# Patient Record
Sex: Male | Born: 1950 | Race: White | Hispanic: No | Marital: Married | Smoking: Never smoker
Health system: Southern US, Community
[De-identification: ages and names within clinical notes are randomized; demographics above are authoritative.]

## PROBLEM LIST (undated history)

## (undated) DIAGNOSIS — K5792 Diverticulitis of intestine, part unspecified, without perforation or abscess without bleeding: Secondary | ICD-10-CM

## (undated) DIAGNOSIS — K219 Gastro-esophageal reflux disease without esophagitis: Secondary | ICD-10-CM

## (undated) DIAGNOSIS — G4730 Sleep apnea, unspecified: Secondary | ICD-10-CM

## (undated) DIAGNOSIS — I10 Essential (primary) hypertension: Secondary | ICD-10-CM

## (undated) DIAGNOSIS — M1990 Unspecified osteoarthritis, unspecified site: Secondary | ICD-10-CM

## (undated) DIAGNOSIS — D34 Benign neoplasm of thyroid gland: Secondary | ICD-10-CM

## (undated) DIAGNOSIS — I82409 Acute embolism and thrombosis of unspecified deep veins of unspecified lower extremity: Secondary | ICD-10-CM

## (undated) HISTORY — PX: CATARACT EXTRACTION: SUR2

## (undated) HISTORY — PX: APPENDECTOMY: SHX54

## (undated) HISTORY — PX: JOINT REPLACEMENT: SHX530

## (undated) HISTORY — PX: THYROID LOBECTOMY: SHX420

## (undated) HISTORY — PX: STAPEDES SURGERY: SHX789

## (undated) HISTORY — PX: COLON RESECTION: SHX5231

## (undated) HISTORY — PX: ROTATOR CUFF REPAIR: SHX139

---

## 2011-06-15 ENCOUNTER — Encounter: Payer: Self-pay | Admitting: *Deleted

## 2011-06-15 ENCOUNTER — Emergency Department (HOSPITAL_BASED_OUTPATIENT_CLINIC_OR_DEPARTMENT_OTHER)
Admission: EM | Admit: 2011-06-15 | Discharge: 2011-06-15 | Disposition: A | Discharge status: Home / Self Care | Payer: BC Managed Care – PPO | Attending: Emergency Medicine | Admitting: Emergency Medicine

## 2011-06-15 ENCOUNTER — Emergency Department (HOSPITAL_BASED_OUTPATIENT_CLINIC_OR_DEPARTMENT_OTHER): Payer: BC Managed Care – PPO

## 2011-06-15 ENCOUNTER — Emergency Department (INDEPENDENT_AMBULATORY_CARE_PROVIDER_SITE_OTHER): Payer: BC Managed Care – PPO

## 2011-06-15 DIAGNOSIS — M79609 Pain in unspecified limb: Secondary | ICD-10-CM | POA: Insufficient documentation

## 2011-06-15 DIAGNOSIS — G4730 Sleep apnea, unspecified: Secondary | ICD-10-CM | POA: Insufficient documentation

## 2011-06-15 DIAGNOSIS — Z79899 Other long term (current) drug therapy: Secondary | ICD-10-CM | POA: Insufficient documentation

## 2011-06-15 DIAGNOSIS — M79606 Pain in leg, unspecified: Secondary | ICD-10-CM

## 2011-06-15 DIAGNOSIS — X58XXXA Exposure to other specified factors, initial encounter: Secondary | ICD-10-CM

## 2011-06-15 DIAGNOSIS — M25569 Pain in unspecified knee: Secondary | ICD-10-CM

## 2011-06-15 DIAGNOSIS — K219 Gastro-esophageal reflux disease without esophagitis: Secondary | ICD-10-CM | POA: Insufficient documentation

## 2011-06-15 DIAGNOSIS — I10 Essential (primary) hypertension: Secondary | ICD-10-CM | POA: Insufficient documentation

## 2011-06-15 HISTORY — DX: Essential (primary) hypertension: I10

## 2011-06-15 HISTORY — DX: Benign neoplasm of thyroid gland: D34

## 2011-06-15 HISTORY — DX: Diverticulitis of intestine, part unspecified, without perforation or abscess without bleeding: K57.92

## 2011-06-15 HISTORY — DX: Sleep apnea, unspecified: G47.30

## 2011-06-15 HISTORY — DX: Gastro-esophageal reflux disease without esophagitis: K21.9

## 2011-06-15 LAB — BASIC METABOLIC PANEL
BUN: 19 mg/dL (ref 6–23)
Chloride: 105 mEq/L (ref 96–112)
Creatinine, Ser: 0.8 mg/dL (ref 0.50–1.35)
GFR calc Af Amer: >90 mL/min (ref >90)
GFR calc non Af Amer: >90 mL/min (ref >90)

## 2011-06-15 MED ORDER — OXYCODONE-ACETAMINOPHEN 5-325 MG PO TABS: 1.0000 | ORAL_TABLET | Freq: Once | ORAL | Status: DC

## 2011-06-15 NOTE — ED Notes (Signed)
Pt reports posterior left leg pain behind knee which started 1 week ago. Pain resolved but returned today- pt also c/o feeling sweaty earlier

## 2011-06-15 NOTE — ED Provider Notes (Signed)
History  This chart was scribed for Javier Cellar, MD by Bennett Scrape. This patient was seen in room MH10/MH10 and the patient's care was started at 5:22PM.  CSN: [id number] Arrival date & time: 06/15/2011  4:06 PM   First MD Initiated Contact with Patient 06/15/11 1708      Chief Complaint  Patient presents with  . Leg Pain    The history is provided by the patient. No language interpreter was used.   Javier Arroyo is a 60 y.o. male who presents to the Emergency Department complaining of one week of gradual onset, non-changing, intermittent, non-radiating posterior right leg pain/popliteal. Pt reports that the pain brings one episodes of nausea and diaphoresis at maximum. Pt reports that his pain is a 6 out of 10 at the worst and a 2 out of 10 currently. Pt reports that he took ASA and applied unknown gel to the area without improvement in his symptoms. Pt states that bending and pressure ease the pain. Pt denies swelling as an associated symptoms. Pt denies a h/o blood clots, any recent long travels or recent hospitalizations. No h/o VTE in self or family. He states that he does sit quite a bit at work.  Past Medical History  Diagnosis Date  . Hypertension   . Sleep apnea   . Thyroid tumor, benign   . GERD (gastroesophageal reflux disease)   . Diverticulitis     Past Surgical History  Procedure Date  . Rotator cuff repair   . Colon resection   . Appendectomy   . Stapedes surgery     No family history on file.  History  Substance Use Topics  . Smoking status: Never Smoker   . Smokeless tobacco: Never Used  . Alcohol Use: Not on file    Review of Systems A complete 10 system review of systems was obtained and is otherwise negative except as noted in the HPI.   Allergies  Statins and Penicillins  Home Medications   Current Outpatient Rx  Name Route Sig Dispense Refill  . ASPIRIN 325 MG PO TBEC Oral Take 650 mg by mouth once.      . CO Q 10 100 MG PO CAPS  Oral Take 1 tablet by mouth daily.      . OMEGA-3 FATTY ACIDS 1000 MG PO CAPS Oral Take 1 g by mouth daily.      Marland Kitchen MAGNESIUM OXIDE 400 MG PO TABS Oral Take 400 mg by mouth 2 (two) times daily.      Marland Kitchen ONE-DAILY MULTI VITAMINS PO TABS Oral Take 1 tablet by mouth daily.      . NEBIVOLOL HCL 10 MG PO TABS Oral Take 10 mg by mouth daily.      Marland Kitchen TAMSULOSIN HCL 0.4 MG PO CAPS Oral Take 0.4 mg by mouth.      . TESTOSTERONE 20.25 MG/ACT (1.62%) TD GEL Transdermal Place 40.5 mg onto the skin daily.      Marland Kitchen VALSARTAN-HYDROCHLOROTHIAZIDE 320-12.5 MG PO TABS Oral Take 1 tablet by mouth daily.        Triage Vitals: BP 136/86  Pulse 70  Temp(Src) 98.5 F (36.9 C) (Oral)  Resp 24  Ht 5\' 8"  (1.727 m)  Wt 260 lb (117.935 kg)  BMI 39.53 kg/m2  SpO2 98%  Physical Exam  Nursing note and vitals reviewed. Constitutional: He is oriented to person, place, and time. He appears well-developed and well-nourished.  HENT:  Head: Normocephalic and atraumatic.  Eyes: EOM are normal. Pupils  are equal, round, and reactive to light.  Neck: Neck supple. No tracheal deviation present.  Cardiovascular: Normal rate, regular rhythm and normal heart sounds.  Exam reveals no gallop and no friction rub.   No murmur heard. Pulmonary/Chest: Effort normal and breath sounds normal.       Lungs are clear to ausculation bilaterally.  Abdominal: Soft. There is no tenderness. There is no rebound and no guarding.  Musculoskeletal: Normal range of motion. He exhibits no edema and no tenderness (No tenderness to palpation to right calf or right knee, full ROM with no tenderness, calves are equal in length and size).       No reproducible popliteal fossa ttp No popliteal fullness Distal pulses intact Cap refill < 2sec  Strength rle 5/5  Neurological: He is alert and oriented to person, place, and time.  Skin: Skin is warm and dry.    ED Course  Procedures (including critical care time)  DIAGNOSTIC STUDIES: Oxygen Saturation  is 98% on room air, normal by my interpretation.    COORDINATION OF CARE: 5:30Pm-Discussed treatment plan with pt at bedside and pt agreed with plan.    Labs Reviewed  BASIC METABOLIC PANEL  D-DIMER, QUANTITATIVE   Dg Knee Complete 4 Views Right  06/15/2011  *RADIOLOGY REPORT*  Clinical Data: Right knee pain/injury  RIGHT KNEE - COMPLETE 4+ VIEW  Comparison: None.  Findings: No fracture or dislocation is seen.  The joint spaces are preserved.  The visualized soft tissues are unremarkable.  No suprapatellar knee joint effusion.  Infra and suprapatellar enthesopathy.  IMPRESSION: No acute osseous abnormality is seen.  Original Report Authenticated By: Charline Bills, M.D.     1. Leg pain     MDM   Popliteal pain. Low risk DVT and negative dimer. Full ROM, neurovascularly intact. Will refer for outpatient U/S tomorrow here at [facility]. Tylenol prn pain. F/U with PMD  I personally performed the services described in this documentation, which was scribed in my presence. The recorded information has been reviewed and considered.     Javier Cellar, MD 06/15/11 [phone]

## 2011-06-16 ENCOUNTER — Ambulatory Visit (HOSPITAL_BASED_OUTPATIENT_CLINIC_OR_DEPARTMENT_OTHER)
Admission: RE | Admit: 2011-06-16 | Discharge: 2011-06-16 | Disposition: A | Discharge status: Home / Self Care | Payer: BC Managed Care – PPO | Source: Ambulatory Visit | Attending: Emergency Medicine | Admitting: Emergency Medicine

## 2011-06-16 ENCOUNTER — Other Ambulatory Visit (HOSPITAL_BASED_OUTPATIENT_CLINIC_OR_DEPARTMENT_OTHER): Payer: Self-pay | Admitting: Emergency Medicine

## 2011-06-16 DIAGNOSIS — M25569 Pain in unspecified knee: Secondary | ICD-10-CM

## 2011-06-16 DIAGNOSIS — R52 Pain, unspecified: Secondary | ICD-10-CM

## 2011-06-16 DIAGNOSIS — X58XXXA Exposure to other specified factors, initial encounter: Secondary | ICD-10-CM

## 2011-06-16 DIAGNOSIS — S99919A Unspecified injury of unspecified ankle, initial encounter: Secondary | ICD-10-CM

## 2011-06-16 DIAGNOSIS — M79609 Pain in unspecified limb: Secondary | ICD-10-CM | POA: Insufficient documentation

## 2018-01-18 ENCOUNTER — Emergency Department (HOSPITAL_BASED_OUTPATIENT_CLINIC_OR_DEPARTMENT_OTHER)
Admission: EM | Admit: 2018-01-18 | Discharge: 2018-01-18 | Disposition: A | Discharge status: Home / Self Care | Payer: 59 | Attending: Emergency Medicine | Admitting: Emergency Medicine

## 2018-01-18 ENCOUNTER — Encounter (HOSPITAL_BASED_OUTPATIENT_CLINIC_OR_DEPARTMENT_OTHER): Payer: Self-pay | Admitting: *Deleted

## 2018-01-18 ENCOUNTER — Other Ambulatory Visit: Payer: Self-pay

## 2018-01-18 DIAGNOSIS — Z7982 Long term (current) use of aspirin: Secondary | ICD-10-CM | POA: Insufficient documentation

## 2018-01-18 DIAGNOSIS — H5712 Ocular pain, left eye: Secondary | ICD-10-CM | POA: Diagnosis present

## 2018-01-18 DIAGNOSIS — Z79899 Other long term (current) drug therapy: Secondary | ICD-10-CM | POA: Insufficient documentation

## 2018-01-18 DIAGNOSIS — I10 Essential (primary) hypertension: Secondary | ICD-10-CM | POA: Diagnosis not present

## 2018-01-18 DIAGNOSIS — H1132 Conjunctival hemorrhage, left eye: Secondary | ICD-10-CM | POA: Diagnosis not present

## 2018-01-18 HISTORY — DX: Unspecified osteoarthritis, unspecified site: M19.90

## 2018-01-18 MED ORDER — TETRACAINE HCL 0.5 % OP SOLN
1.0000 [drp] | Freq: Once | OPHTHALMIC | Status: AC
  Administered 2018-01-18: 1 [drp] via OPHTHALMIC
  Filled 2018-01-18: qty 4

## 2018-01-18 MED ORDER — FLUORESCEIN SODIUM 1 MG OP STRP
1.0000 | ORAL_STRIP | Freq: Once | OPHTHALMIC | Status: AC
  Administered 2018-01-18: 1 via OPHTHALMIC
  Filled 2018-01-18: qty 1

## 2018-01-18 NOTE — ED Notes (Signed)
ED Provider at bedside. 

## 2018-01-18 NOTE — Discharge Instructions (Signed)
As we discussed, please follow-up with your eye doctor.  Call his office tomorrow and arrange for an appointment.  Return the emergency department for any vision changes, worsening pain, fever, redness or swelling of the face or any other worsening or concerning symptoms.

## 2018-01-18 NOTE — ED Provider Notes (Addendum)
Medical screening examination/treatment/procedure(s) were conducted as a shared visit with non-physician practitioner(s) and myself.  I personally evaluated the patient during the encounter.  None   Patient seen by me along with physician assistant.  Patient this evening around 5 PM did have a couple hard sneezes and also was rubbing his left eye and he felt as if may be something popped.  And then he had all this blood in the white part of his eye.  Contain not bleeding out of the eye.  Patient about 2 months ago had cataract surgery so he has an ophthalmologist to follow-up with.  He had some leftover moxifloxacin eyedrops which he put in.  Patient without any eye pain.  No evidence of hyphema.  Corneal abrasion ruled out by physician assistant.  This looks like a scleral hemorrhage.  Non-complicated.  Patient can follow-up with ophthalmologist just out of concern from the cataract surgery.  No visual changes at all.  No photophobia.   Fredia Sorrow, MD 01/18/18 2919    Fredia Sorrow, MD 01/19/18 [phone]

## 2018-01-18 NOTE — ED Notes (Signed)
Wore glasses during visual acuity

## 2018-01-18 NOTE — ED Provider Notes (Signed)
[facility] EMERGENCY DEPARTMENT Provider Note   CSN: [id number] Arrival date & time: 01/18/18  2121     History   Chief Complaint Chief Complaint  Patient presents with  . Eye Injury    HPI Javier Arroyo is a 67 y.o. male who presents for evaluation of left eye pain, redness that began this evening.  Patient reports that he was watching TV when he felt like he had something in his eye.  Patient states he started rubbing it and trying to press it to get the pain to be better.  He states he went in the bathroom and noticed that his eye was complete really red.  He does state that he was working on the yard earlier this evening but but does not recall any specific trauma, injury to the eye.  Patient reports that he was wearing glasses at the time.  Does not have a history of cataract surgery and states that he had some leftover moxifloxacin eyedrops which he states he used.  He reports that he is not having any blurry vision.  She denies any fevers.  The history is provided by the patient.    Past Medical History:  Diagnosis Date  . Arthritis   . Diverticulitis   . GERD (gastroesophageal reflux disease)   . Hypertension   . Sleep apnea   . Thyroid tumor, benign     There are no active problems to display for this patient.   Past Surgical History:  Procedure Laterality Date  . APPENDECTOMY    . CATARACT EXTRACTION Left   . COLON RESECTION    . ROTATOR CUFF REPAIR    . STAPEDES SURGERY    . THYROID LOBECTOMY          Home Medications    Prior to Admission medications   Medication Sig Start Date End Date Taking? Authorizing Provider  Coenzyme Q10 (CO Q 10) 100 MG CAPS Take 1 tablet by mouth daily.     Yes [provider]  fexofenadine (ALLEGRA) 180 MG tablet Take 180 mg by mouth daily.   Yes [provider]  fish oil-omega-3 fatty acids 1000 MG capsule Take 1 g by mouth daily.     Yes [provider]  Multiple Vitamin  (MULTIVITAMIN) tablet Take 1 tablet by mouth daily.     Yes [provider]  nebivolol (BYSTOLIC) 10 MG tablet Take 10 mg by mouth daily.     Yes [provider]  valsartan-hydrochlorothiazide (DIOVAN-HCT) 320-12.5 MG per tablet Take 1 tablet by mouth daily.     Yes [provider]  aspirin 325 MG EC tablet Take 650 mg by mouth once.      [provider]  magnesium oxide (MAG-OX) 400 MG tablet Take 400 mg by mouth 2 (two) times daily.      [provider]  Tamsulosin HCl (FLOMAX) 0.4 MG CAPS Take 0.4 mg by mouth.      [provider]  Testosterone (ANDROGEL PUMP) 20.25 MG/ACT (1.62%) GEL Place 40.5 mg onto the skin daily.      [provider]    Family History No family history on file.  Social History Social History   Tobacco Use  . Smoking status: Never Smoker  . Smokeless tobacco: Never Used  Substance Use Topics  . Alcohol use: Yes    Alcohol/week: 1.8 oz    Types: 3 Glasses of wine per week  . Drug use: No  Allergies   Statins and Penicillins   Review of Systems Review of Systems  Constitutional: Negative for fever.  Eyes: Positive for pain and redness. Negative for visual disturbance.     Physical Exam Updated Vital Signs BP 132/76 (BP Location: Right Arm)   Pulse (!) 56   Temp 98.5 F (36.9 C) (Oral)   Resp 20   SpO2 96%   Physical Exam  Constitutional: He appears well-developed and well-nourished.  HENT:  Head: Normocephalic and atraumatic.  Eyes: Pupils are equal, round, and reactive to light. EOM and lids are normal. Right eye exhibits no discharge. Left eye exhibits no discharge. Right conjunctiva has no hemorrhage. Left conjunctiva has a hemorrhage. No scleral icterus.  EOMs intact without any difficulty.  Subconjunctival hemorrhage noted to the left eye.  No hypopyon, hyphema.  No tenderness, warmth, erythema, edema noted to the periorbital region bilaterally. No consensual pain of right  eye.   Pulmonary/Chest: Effort normal.  Neurological: He is alert.  Skin: Skin is warm and dry.  Psychiatric: He has a normal mood and affect. His speech is normal and behavior is normal.  Nursing note and vitals reviewed.    ED Treatments / Results  Labs (all labs ordered are listed, but only abnormal results are displayed) Labs Reviewed - No data to display  EKG None  Radiology No results found.  Procedures Procedures (including critical care time)  Medications Ordered in ED Medications  tetracaine (PONTOCAINE) 0.5 % ophthalmic solution 1 drop (1 drop Both Eyes Given 01/18/18 2220)  fluorescein ophthalmic strip 1 strip (1 strip Both Eyes Given 01/18/18 2220)     Initial Impression / Assessment and Plan / ED Course  I have reviewed the triage vital signs and the nursing notes.  Pertinent labs & imaging results that were available during my care of the patient were reviewed by me and considered in my medical decision making (see chart for details).     67 year old male who presents for evaluation of left eye pain and redness.  Reports he was sitting watching TV when he started having some pain.  He reports that he went to the bathroom and noticed that his eye was completely red.  Does reports he was working out in the yard today but does not recall specific trauma, injury.  He was wearing glasses at this time.  Does have a history of left cataract surgery and reports that he took some leftover moxifloxacin drops.  On evaluation, he does have subconjunctival hemorrhage noted to the left eye.  EOMs intact without difficulty.  No hyphema, hypopyon.  Plan to check visual acuity, evaluation with Woods lamp and Tono-Pen.  Evaluation with Sherral Hammers lamp showed no evidence of floor seen uptake or corneal abrasion.  IOP's as documented below.  Exam is not concerning for corneal abrasion, glaucoma.  Intraocular pressure as documented below:  Left IOP: 18 Right IOP: 15    Visual  Acuity  Right Eye Distance: 20/25 Left Eye Distance: 20/30 Bilateral Distance: 20/20  Suspect symptoms are related to subconjunctival hemorrhage.  No evidence of corneal abrasion, hyphema, hypopyon on exam.  Patient does have an ophthalmologist that he sees.  Instructed him to follow-up with him tomorrow. Patient had ample opportunity for questions and discussion. All patient's questions were answered with full understanding. Strict return precautions discussed. Patient expresses understanding and agreement to plan.    Final Clinical Impressions(s) / ED Diagnoses   Final diagnoses:  Subconjunctival hemorrhage of left eye    ED  Discharge Orders    None       Desma Mcgregor 01/19/18 Henrietta Hoover, MD 01/19/18 1556    Fredia Sorrow, MD 01/19/18 1606

## 2018-01-18 NOTE — ED Notes (Signed)
Pt and FM given d/c instructions as per chart. Verbalize understanding. No questions.

## 2018-01-18 NOTE — ED Triage Notes (Signed)
Pt reports he was working out in the yard today and may have gotten something in his eye. Reports pain around 5pm. States "Ipushed around on it". Also he used moxifloxacin drops he had left over from cataract surgery in May. Left eye is very red

## 2019-02-02 ENCOUNTER — Other Ambulatory Visit: Payer: Self-pay

## 2019-02-02 ENCOUNTER — Emergency Department (HOSPITAL_BASED_OUTPATIENT_CLINIC_OR_DEPARTMENT_OTHER)
Admission: EM | Admit: 2019-02-02 | Discharge: 2019-02-02 | Disposition: A | Discharge status: Home / Self Care | Payer: Medicare Other | Attending: Emergency Medicine | Admitting: Emergency Medicine

## 2019-02-02 ENCOUNTER — Encounter (HOSPITAL_BASED_OUTPATIENT_CLINIC_OR_DEPARTMENT_OTHER): Payer: Self-pay | Admitting: *Deleted

## 2019-02-02 DIAGNOSIS — Z79899 Other long term (current) drug therapy: Secondary | ICD-10-CM | POA: Diagnosis not present

## 2019-02-02 DIAGNOSIS — I10 Essential (primary) hypertension: Secondary | ICD-10-CM | POA: Insufficient documentation

## 2019-02-02 DIAGNOSIS — N3091 Cystitis, unspecified with hematuria: Secondary | ICD-10-CM | POA: Insufficient documentation

## 2019-02-02 DIAGNOSIS — N3001 Acute cystitis with hematuria: Secondary | ICD-10-CM

## 2019-02-02 DIAGNOSIS — R319 Hematuria, unspecified: Secondary | ICD-10-CM | POA: Diagnosis present

## 2019-02-02 LAB — URINALYSIS, ROUTINE W REFLEX MICROSCOPIC
Bilirubin Urine: NEGATIVE
Glucose, UA: NEGATIVE mg/dL
Ketones, ur: NEGATIVE mg/dL
Nitrite: POSITIVE — AB
Protein, ur: NEGATIVE mg/dL
Specific Gravity, Urine: 1.02 (ref 1.005–1.030)
pH: 5.5 (ref 5.0–8.0)

## 2019-02-02 LAB — URINALYSIS, MICROSCOPIC (REFLEX): WBC, UA: >50 WBC/hpf (ref 0–5)

## 2019-02-02 MED ORDER — CEPHALEXIN 500 MG PO CAPS: 1000.0000 mg | ORAL_CAPSULE | Freq: Two times a day (BID) | ORAL | 0 refills | Status: AC

## 2019-02-02 MED ORDER — ACETAMINOPHEN 500 MG PO TABS
1000.0000 mg | ORAL_TABLET | Freq: Once | ORAL | Status: AC
  Administered 2019-02-02: 12:00:00 1000 mg via ORAL
  Filled 2019-02-02: qty 2

## 2019-02-02 MED ORDER — CEFTRIAXONE SODIUM 1 G IJ SOLR
1.0000 g | Freq: Once | INTRAMUSCULAR | Status: AC
  Administered 2019-02-02: 1 g via INTRAMUSCULAR
  Filled 2019-02-02: qty 10

## 2019-02-02 MED ORDER — LIDOCAINE HCL (PF) 1 % IJ SOLN
INTRAMUSCULAR | Status: AC
  Administered 2019-02-02: 12:00:00
  Filled 2019-02-02: qty 5

## 2019-02-02 NOTE — ED Notes (Signed)
Pt now states he had a temp of 101 last pm

## 2019-02-02 NOTE — ED Provider Notes (Signed)
[facility] EMERGENCY DEPARTMENT Provider Note   CSN: [id number] Arrival date & time: 02/02/19  1008     History   Chief Complaint Chief Complaint  Patient presents with  . Hematuria    HPI Javier Arroyo is a 68 y.o. male.     HPI Patient noted some blood in his urine stream starting yesterday evening.  He denies any clots.  No pain.  He reports he always urinates fairly frequently at baseline.  He had a history of a TURP about 5 years ago.  He reports last night he actually noted he had some chills and took his temperature and had a temperature of 101.  He reports he feels fine now.  He does not have any pain.  No back pain no flank pain and no abdominal pain.  He does not feel nauseated or generally weak.  He reports he did have a urinary tract infection several years ago.  He is wondering if he had one again.  He is due to see his urologist mid August for recheck on urinary frequency.  He is seen by weight [employer] at [facility] [employer]. Past Medical History:  Diagnosis Date  . Arthritis   . Diverticulitis   . GERD (gastroesophageal reflux disease)   . Hypertension   . Sleep apnea   . Thyroid tumor, benign     There are no active problems to display for this patient.   Past Surgical History:  Procedure Laterality Date  . APPENDECTOMY    . CATARACT EXTRACTION Left   . COLON RESECTION    . ROTATOR CUFF REPAIR    . STAPEDES SURGERY    . THYROID LOBECTOMY          Home Medications    Prior to Admission medications   Medication Sig Start Date End Date Taking? Authorizing Provider  aspirin 325 MG EC tablet Take 650 mg by mouth once.      [provider]  Coenzyme Q10 (CO Q 10) 100 MG CAPS Take 1 tablet by mouth daily.      [provider]  fexofenadine (ALLEGRA) 180 MG tablet Take 180 mg by mouth daily.    [provider]  fish oil-omega-3 fatty acids 1000 MG capsule Take 1 g by mouth daily.      [provider]   magnesium oxide (MAG-OX) 400 MG tablet Take 400 mg by mouth 2 (two) times daily.      [provider]  Multiple Vitamin (MULTIVITAMIN) tablet Take 1 tablet by mouth daily.      [provider]  nebivolol (BYSTOLIC) 10 MG tablet Take 10 mg by mouth daily.      [provider]  Tamsulosin HCl (FLOMAX) 0.4 MG CAPS Take 0.4 mg by mouth.      [provider]  Testosterone (ANDROGEL PUMP) 20.25 MG/ACT (1.62%) GEL Place 40.5 mg onto the skin daily.      [provider]  valsartan-hydrochlorothiazide (DIOVAN-HCT) 320-12.5 MG per tablet Take 1 tablet by mouth daily.      [provider]    Family History No family history on file.  Social History Social History   Tobacco Use  . Smoking status: Never Smoker  . Smokeless tobacco: Never Used  Substance Use Topics  . Alcohol use: Yes    Alcohol/week: 3.0 standard drinks    Types: 3 Glasses of wine per week  . Drug use: No     Allergies   Statins  and Penicillins   Review of Systems Review of Systems 10 Systems reviewed and are negative for acute change except as noted in the HPI.   Physical Exam Updated Vital Signs BP 115/76 (BP Location: Right Arm)   Pulse 75   Temp 99 F (37.2 C) (Oral)   Resp 16   Ht 5\' 8"  (1.727 m)   Wt 114.8 kg   SpO2 99%   BMI 38.47 kg/m   Physical Exam Constitutional:      Comments: Alert and nontoxic.  Mental status clear.  No distress.  HENT:     Head: Normocephalic and atraumatic.  Cardiovascular:     Rate and Rhythm: Normal rate and regular rhythm.  Pulmonary:     Effort: Pulmonary effort is normal.     Breath sounds: Normal breath sounds.  Abdominal:     General: There is no distension.     Palpations: Abdomen is soft.     Tenderness: There is no abdominal tenderness. There is no guarding.     Comments: No flank or CVA tenderness.  Musculoskeletal: Normal range of motion.        General: No swelling or tenderness.     Comments: Trace  pretibial pitting edema.  Skin:    General: Skin is warm and dry.  Neurological:     General: No focal deficit present.     Mental Status: He is oriented to person, place, and time.     Coordination: Coordination normal.  Psychiatric:        Mood and Affect: Mood normal.      ED Treatments / Results  Labs (all labs ordered are listed, but only abnormal results are displayed) Labs Reviewed  URINALYSIS, ROUTINE W REFLEX MICROSCOPIC    EKG None  Radiology No results found.  Procedures Procedures (including critical care time)  Medications Ordered in ED Medications - No data to display   Initial Impression / Assessment and Plan / ED Course  I have reviewed the triage vital signs and the nursing notes.  Pertinent labs & imaging results that were available during my care of the patient were reviewed by me and considered in my medical decision making (see chart for details).       Patient has developed hematuria and fever documented at home.  He otherwise denies symptoms and is clinically well in appearance.  No associated pain.  Vital signs are stable.  No hypotension or tachycardia.  No general  constitutional signs at this time.  Patient will be started on Rocephin in the emergency department (he reports his penicillin allergy was diagnosed as a child because he got a rash and was told not to take it anymore.  He reports he is had multiple antibiotic therapy since that time and never had allergic reaction).  Return precautions have been reviewed.  Patient aware of the necessity to return if he should develop any pain increasing or persisting fever or other concerning symptoms.  Final Clinical Impressions(s) / ED Diagnoses   Final diagnoses:  Acute cystitis with hematuria    ED Discharge Orders    None       Charlesetta Shanks, MD 02/02/19 1220

## 2019-02-02 NOTE — Discharge Instructions (Signed)
1.  Start taking Keflex as prescribed tomorrow at noon. 2.  Take acetaminophen if needed for chills or fever. 3.  Return to the emergency department immediately if you start developing lower abdominal or flank pain, feeling generally more ill, weak or develop vomiting and cannot take your medications. 4.  Call your urologist today and let them know that you are being treated for urinary tract infection, see if you can get your follow-up appointment sooner.

## 2019-02-02 NOTE — ED Triage Notes (Addendum)
Small amount of blood in urine when starting stream  Denies pain  States died a lot of heavy lifting yesterday  Also decreased urine output x 1 month  Has appointment w his md in august for this problem

## 2019-02-02 NOTE — ED Notes (Signed)
ED Provider at bedside. 

## 2019-02-04 LAB — URINE CULTURE: Culture: >=100000 — AB

## 2019-02-05 ENCOUNTER — Telehealth: Payer: Self-pay | Admitting: Emergency Medicine

## 2019-02-05 NOTE — Telephone Encounter (Signed)
Post ED Visit - Positive Culture Follow-up  Culture report reviewed by antimicrobial stewardship pharmacist: [place] Team []  Elenor Quinones, Pharm.D. []  Heide Guile, Pharm.D., BCPS AQ-ID []  Parks Neptune, Pharm.D., BCPS []  Alycia Rossetti, Pharm.D., BCPS []  [place], [place].D., BCPS, AAHIVP []  Legrand Como, Pharm.D., BCPS, AAHIVP []  Salome Arnt, PharmD, BCPS []  Johnnette Gourd, PharmD, BCPS []  Hughes Better, PharmD, BCPS []  Leeroy Cha, PharmD []  Laqueta Linden, PharmD, BCPS []  Albertina Parr, PharmD Elicia Lamp PharmD  [place] Team []  Leodis Sias, PharmD []  Lindell Spar, PharmD []  Royetta Asal, PharmD []  Graylin Shiver, Rph []  Rema Fendt) Glennon Mac, PharmD []  Arlyn Dunning, PharmD []  Netta Cedars, PharmD []  Dia Sitter, PharmD []  Leone Haven, PharmD []  Gretta Arab, PharmD []  Theodis Shove, PharmD []  Peggyann Juba, PharmD []  Reuel Boom, PharmD   Positive urine culture Treated with cephalexin, organism sensitive to the same and no further patient follow-up is required at this time.  Hazle Nordmann 02/05/2019, 12:34 PM

## 2019-06-10 ENCOUNTER — Emergency Department (HOSPITAL_BASED_OUTPATIENT_CLINIC_OR_DEPARTMENT_OTHER)
Admission: EM | Admit: 2019-06-10 | Discharge: 2019-06-10 | Disposition: A | Discharge status: Home / Self Care | Payer: Medicare Other | Attending: Emergency Medicine | Admitting: Emergency Medicine

## 2019-06-10 ENCOUNTER — Other Ambulatory Visit: Payer: Self-pay

## 2019-06-10 ENCOUNTER — Encounter (HOSPITAL_BASED_OUTPATIENT_CLINIC_OR_DEPARTMENT_OTHER): Payer: Self-pay | Admitting: Emergency Medicine

## 2019-06-10 ENCOUNTER — Emergency Department (HOSPITAL_BASED_OUTPATIENT_CLINIC_OR_DEPARTMENT_OTHER): Payer: Medicare Other

## 2019-06-10 DIAGNOSIS — Z888 Allergy status to other drugs, medicaments and biological substances status: Secondary | ICD-10-CM | POA: Insufficient documentation

## 2019-06-10 DIAGNOSIS — Z7982 Long term (current) use of aspirin: Secondary | ICD-10-CM | POA: Insufficient documentation

## 2019-06-10 DIAGNOSIS — K219 Gastro-esophageal reflux disease without esophagitis: Secondary | ICD-10-CM | POA: Insufficient documentation

## 2019-06-10 DIAGNOSIS — Z880 Allergy status to penicillin: Secondary | ICD-10-CM | POA: Insufficient documentation

## 2019-06-10 DIAGNOSIS — I10 Essential (primary) hypertension: Secondary | ICD-10-CM | POA: Insufficient documentation

## 2019-06-10 DIAGNOSIS — R0789 Other chest pain: Secondary | ICD-10-CM | POA: Insufficient documentation

## 2019-06-10 DIAGNOSIS — Z79899 Other long term (current) drug therapy: Secondary | ICD-10-CM | POA: Insufficient documentation

## 2019-06-10 LAB — CBC WITH DIFFERENTIAL/PLATELET
Abs Immature Granulocytes: 0.01 10*3/uL (ref 0.00–0.07)
Basophils Absolute: 0.1 10*3/uL (ref 0.0–0.1)
Basophils Relative: 1 %
Eosinophils Absolute: 0.7 10*3/uL — ABNORMAL HIGH (ref 0.0–0.5)
Eosinophils Relative: 9 %
HCT: 44.1 % (ref 39.0–52.0)
Hemoglobin: 14.4 g/dL (ref 13.0–17.0)
Immature Granulocytes: 0 %
Lymphocytes Relative: 28 %
Lymphs Abs: 2.1 10*3/uL (ref 0.7–4.0)
MCH: 29.6 pg (ref 26.0–34.0)
MCHC: 32.7 g/dL (ref 30.0–36.0)
MCV: 90.7 fL (ref 80.0–100.0)
Monocytes Absolute: 0.5 10*3/uL (ref 0.1–1.0)
Monocytes Relative: 6 %
Neutro Abs: 4.1 10*3/uL (ref 1.7–7.7)
Neutrophils Relative %: 56 %
Platelets: 192 10*3/uL (ref 150–400)
RBC: 4.86 MIL/uL (ref 4.22–5.81)
RDW: 13.1 % (ref 11.5–15.5)
WBC: 7.4 10*3/uL (ref 4.0–10.5)
nRBC: 0 % (ref 0.0–0.2)

## 2019-06-10 LAB — TROPONIN I (HIGH SENSITIVITY)
Troponin I (High Sensitivity): 4 ng/L (ref <18)
Troponin I (High Sensitivity): 3 ng/L (ref <18)

## 2019-06-10 LAB — BASIC METABOLIC PANEL
Anion gap: 7 (ref 5–15)
BUN: 17 mg/dL (ref 8–23)
CO2: 23 mmol/L (ref 22–32)
Calcium: 9.2 mg/dL (ref 8.9–10.3)
Chloride: 107 mmol/L (ref 98–111)
Creatinine, Ser: 0.72 mg/dL (ref 0.61–1.24)
GFR calc Af Amer: >60 mL/min (ref >60)
GFR calc non Af Amer: >60 mL/min (ref >60)
Glucose, Bld: 109 mg/dL — ABNORMAL HIGH (ref 70–99)
Potassium: 3.6 mmol/L (ref 3.5–5.1)
Sodium: 137 mmol/L (ref 135–145)

## 2019-06-10 MED ORDER — PANTOPRAZOLE SODIUM 40 MG IV SOLR
40.0000 mg | Freq: Once | INTRAVENOUS | Status: AC
  Administered 2019-06-10: 05:00:00 40 mg via INTRAVENOUS
  Filled 2019-06-10: qty 40

## 2019-06-10 NOTE — ED Triage Notes (Signed)
Pt states he has a warming sensation in his chest then the feeling subsides and then comes back  Denies any other symptoms  States it is not a pain or even a discomfort but just a warming sensation

## 2019-06-10 NOTE — ED Provider Notes (Signed)
[facility] Provider Note: Georgena Spurling, MD, FACEP  CSN: [id number] MRN: [id number] ARRIVAL: 06/10/19 at [place]: MHOTF/OTF   CHIEF COMPLAINT  chest discomfort   HISTORY OF PRESENT ILLNESS  06/10/19 4:02 AM Javier Arroyo is a 68 y.o. male with about a 2-week history of episodic sensation of warmth in his chest.  It is not a pain and it is not unpleasant.  It is not severe.  It comes and goes and is worse at night.  It seems to come on when he has been sedentary the previous day and less so when he has been active the previous day.  He has no associated shortness of breath, diaphoresis, nausea, vomiting, diarrhea, dysuria or acid reflux.  He has a history of GERD but states those symptoms were different.  He is here after awakening at 2 AM with chills and having subsequent episodes of this warm feeling.  He has had similar episodes of chills in the past as well.   Past Medical History:  Diagnosis Date  . Arthritis   . Diverticulitis   . GERD (gastroesophageal reflux disease)   . Hypertension   . Sleep apnea   . Thyroid tumor, benign     Past Surgical History:  Procedure Laterality Date  . APPENDECTOMY    . CATARACT EXTRACTION Left   . COLON RESECTION    . ROTATOR CUFF REPAIR    . STAPEDES SURGERY    . THYROID LOBECTOMY      Family History  Problem Relation Age of Onset  . Thyroid disease Mother   . Hypertension Mother   . Alzheimer's disease Father   . Glaucoma Father     Social History   Tobacco Use  . Smoking status: Never Smoker  . Smokeless tobacco: Never Used  Substance Use Topics  . Alcohol use: Yes    Alcohol/week: 3.0 standard drinks    Types: 3 Glasses of wine per week  . Drug use: No    Prior to Admission medications   Medication Sig Start Date End Date Taking? Authorizing Provider  aspirin 325 MG EC tablet Take 650 mg by mouth once.      [provider]  cephALEXin (KEFLEX) 500 MG capsule Take 2 capsules (1,000 mg  total) by mouth 2 (two) times daily. 02/03/19   Charlesetta Shanks, MD  Coenzyme Q10 (CO Q 10) 100 MG CAPS Take 1 tablet by mouth daily.      [provider]  fexofenadine (ALLEGRA) 180 MG tablet Take 180 mg by mouth daily.    [provider]  fish oil-omega-3 fatty acids 1000 MG capsule Take 1 g by mouth daily.      [provider]  magnesium oxide (MAG-OX) 400 MG tablet Take 400 mg by mouth 2 (two) times daily.      [provider]  Multiple Vitamin (MULTIVITAMIN) tablet Take 1 tablet by mouth daily.      [provider]  nebivolol (BYSTOLIC) 10 MG tablet Take 10 mg by mouth daily.      [provider]  Tamsulosin HCl (FLOMAX) 0.4 MG CAPS Take 0.4 mg by mouth.      [provider]  Testosterone (ANDROGEL PUMP) 20.25 MG/ACT (1.62%) GEL Place 40.5 mg onto the skin daily.      [provider]  valsartan-hydrochlorothiazide (DIOVAN-HCT) 320-12.5 MG per tablet Take 1 tablet by mouth daily.      [provider]    Allergies Statins and  Penicillins   REVIEW OF SYSTEMS  Negative except as noted here or in the History of Present Illness.   PHYSICAL EXAMINATION  Initial Vital Signs Blood pressure 135/74, pulse 60, temperature 98.4 F (36.9 C), temperature source Oral, resp. rate 18, height 5\' 10"  (1.778 m), weight 108.4 kg, SpO2 99 %.  Examination General: Well-developed, well-nourished male in no acute distress; appearance consistent with age of record HENT: normocephalic; atraumatic Eyes: pupils equal, round and reactive to light; extraocular muscles intact; left pseudophakia Neck: supple Heart: regular rate and rhythm Lungs: clear to auscultation bilaterally Abdomen: soft; nondistended; nontender; bowel sounds present Extremities: No deformity; full range of motion; pulses normal; no edema Neurologic: Awake, alert and oriented; motor function intact in all extremities and symmetric; no facial droop Skin: Warm  and dry Psychiatric: Normal mood and affect   RESULTS  Summary of this visit's results, reviewed and interpreted by myself:   EKG Interpretation  Date/Time:  Thursday June 10 2019 03:46:58 EST Ventricular Rate:  61 PR Interval:    QRS Duration: 98 QT Interval:  418 QTC Calculation: 421 R Axis:   70 Text Interpretation: Sinus rhythm Baseline wander in lead(s) II III aVF No previous ECGs available Confirmed by Shanon Rosser [phone]) on 06/10/2019 3:49:51 AM      Laboratory Studies: Results for orders placed or performed during the hospital encounter of 06/10/19 (from the past 24 hour(s))  CBC with Differential     Status: Abnormal   Collection Time: 06/10/19  4:24 AM  Result Value Ref Range   WBC 7.4 4.0 - 10.5 K/uL   RBC 4.86 4.22 - 5.81 MIL/uL   Hemoglobin 14.4 13.0 - 17.0 g/dL   HCT 44.1 39.0 - 52.0 %   MCV 90.7 80.0 - 100.0 fL   MCH 29.6 26.0 - 34.0 pg   MCHC 32.7 30.0 - 36.0 g/dL   RDW 13.1 11.5 - 15.5 %   Platelets 192 150 - 400 K/uL   nRBC 0.0 0.0 - 0.2 %   Neutrophils Relative % 56 %   Neutro Abs 4.1 1.7 - 7.7 K/uL   Lymphocytes Relative 28 %   Lymphs Abs 2.1 0.7 - 4.0 K/uL   Monocytes Relative 6 %   Monocytes Absolute 0.5 0.1 - 1.0 K/uL   Eosinophils Relative 9 %   Eosinophils Absolute 0.7 (H) 0.0 - 0.5 K/uL   Basophils Relative 1 %   Basophils Absolute 0.1 0.0 - 0.1 K/uL   Immature Granulocytes 0 %   Abs Immature Granulocytes 0.01 0.00 - 0.07 K/uL  Basic metabolic panel     Status: Abnormal   Collection Time: 06/10/19  4:24 AM  Result Value Ref Range   Sodium 137 135 - 145 mmol/L   Potassium 3.6 3.5 - 5.1 mmol/L   Chloride 107 98 - 111 mmol/L   CO2 23 22 - 32 mmol/L   Glucose, Bld 109 (H) 70 - 99 mg/dL   BUN 17 8 - 23 mg/dL   Creatinine, Ser 0.72 0.61 - 1.24 mg/dL   Calcium 9.2 8.9 - 10.3 mg/dL   GFR calc non Af Amer >60 >60 mL/min   GFR calc Af Amer >60 >60 mL/min   Anion gap 7 5 - 15  Troponin I (High Sensitivity)     Status: None   Collection  Time: 06/10/19  4:24 AM  Result Value Ref Range   Troponin I (High Sensitivity) 4 <18 ng/L  Troponin I (High Sensitivity)     Status: None  Collection Time: 06/10/19  6:20 AM  Result Value Ref Range   Troponin I (High Sensitivity) 3 <18 ng/L   Imaging Studies: Cxr Pa/lat  Result Date: 06/10/2019 CLINICAL DATA:  Chest discomfort. EXAM: CHEST - 2 VIEW COMPARISON:  12/06/2015 FINDINGS: The cardiac silhouette, mediastinal and hilar contours are within normal limits and stable. Stable surgical changes with median sternotomy wires. The lungs are clear of an acute process. No worrisome pulmonary lesions. Mild areas of slight pleural thickening are stable bilaterally and may reflect prominent pleural fat. The bony thorax is intact. IMPRESSION: No acute cardiopulmonary findings. Electronically Signed   By: Marijo Sanes M.D.   On: 06/10/2019 05:16    ED COURSE and MDM  Nursing notes, initial and subsequent vitals signs, including pulse oximetry, reviewed and interpreted by myself.  Vitals:   06/10/19 0346 06/10/19 0350 06/10/19 0630 06/10/19 0718  BP:  135/74 128/68 129/81  Pulse:  60 (!) 54 (!) 56  Resp:  18 15 16   Temp:  98.4 F (36.9 C) 98.4 F (36.9 C) 98.3 F (36.8 C)  TempSrc:  Oral Oral Oral  SpO2:  99% 99% 100%  Weight: 108.4 kg     Height: 5\' 10"  (1.778 m)      Medications  pantoprazole (PROTONIX) injection 40 mg (40 mg Intravenous Given 06/10/19 0430)   5:55 AM The patient has had no further warm sensations in his chest while in the emergency department.  His first troponin is normal and the second is pending.  I have a low index of suspicion of significant cardiovascular disease based on his mild symptomatology and ongoing symptoms for the past 2 weeks.  Symptoms could be due to GERD.  He is not currently on any GERD treatment.  Dr. Ronnald Nian will follow up on second troponin and make disposition.   PROCEDURES  Procedures   ED DIAGNOSES     ICD-10-CM   1. Atypical chest  pain  R07.89        Shanon Rosser, MD 06/10/19 2256

## 2019-06-10 NOTE — ED Notes (Signed)
Pt without chest pain, VSS, ready for discharge.  Wife waiting in car for transport home.

## 2019-06-10 NOTE — ED Provider Notes (Signed)
Patient signed out to me awaiting repeat troponin.  Patient with chest discomfort/warm feeling in his chest.  EKG unremarkable.  No significant anemia, electrolyte abnormality.  First troponin is normal.  Possibly reflux related.  Does not have any cardiac risk factors except for age and possible obesity.  Does have a history of hypertension.  Overall story is atypical.  Plan is for discharge if repeat troponin is normal.  Repeat troponin normal.  Recommend follow-up with primary care doctor to discuss further work-up.  Given return precautions and discharged from the ED in good condition.  This chart was dictated using voice recognition software.  Despite best efforts to proofread,  errors can occur which can change the documentation meaning.     Lennice Sites, DO 06/10/19 [id number]

## 2019-06-10 NOTE — Discharge Instructions (Signed)
Follow up with your primary care doctor as discussed

## 2021-07-09 ENCOUNTER — Encounter (HOSPITAL_BASED_OUTPATIENT_CLINIC_OR_DEPARTMENT_OTHER): Payer: Self-pay | Admitting: Emergency Medicine

## 2021-07-09 ENCOUNTER — Emergency Department (HOSPITAL_BASED_OUTPATIENT_CLINIC_OR_DEPARTMENT_OTHER): Payer: Medicare Other

## 2021-07-09 ENCOUNTER — Emergency Department (HOSPITAL_BASED_OUTPATIENT_CLINIC_OR_DEPARTMENT_OTHER)
Admission: EM | Admit: 2021-07-09 | Discharge: 2021-07-09 | Disposition: A | Discharge status: Home / Self Care | Payer: Medicare Other | Attending: Emergency Medicine | Admitting: Emergency Medicine

## 2021-07-09 ENCOUNTER — Other Ambulatory Visit: Payer: Self-pay

## 2021-07-09 DIAGNOSIS — R001 Bradycardia, unspecified: Secondary | ICD-10-CM | POA: Insufficient documentation

## 2021-07-09 DIAGNOSIS — Z20822 Contact with and (suspected) exposure to covid-19: Secondary | ICD-10-CM | POA: Insufficient documentation

## 2021-07-09 DIAGNOSIS — R0602 Shortness of breath: Secondary | ICD-10-CM | POA: Diagnosis present

## 2021-07-09 DIAGNOSIS — Z7982 Long term (current) use of aspirin: Secondary | ICD-10-CM | POA: Insufficient documentation

## 2021-07-09 DIAGNOSIS — R2681 Unsteadiness on feet: Secondary | ICD-10-CM | POA: Insufficient documentation

## 2021-07-09 LAB — CBC
HCT: 43.8 % (ref 39.0–52.0)
Hemoglobin: 14.6 g/dL (ref 13.0–17.0)
MCH: 29.8 pg (ref 26.0–34.0)
MCHC: 33.3 g/dL (ref 30.0–36.0)
MCV: 89.4 fL (ref 80.0–100.0)
Platelets: 174 10*3/uL (ref 150–400)
RBC: 4.9 MIL/uL (ref 4.22–5.81)
RDW: 13.6 % (ref 11.5–15.5)
WBC: 9.1 10*3/uL (ref 4.0–10.5)
nRBC: 0 % (ref 0.0–0.2)

## 2021-07-09 LAB — RESP PANEL BY RT-PCR (FLU A&B, COVID) ARPGX2
Influenza A by PCR: NEGATIVE
Influenza B by PCR: NEGATIVE
SARS Coronavirus 2 by RT PCR: NEGATIVE

## 2021-07-09 LAB — COMPREHENSIVE METABOLIC PANEL
ALT: 31 U/L (ref 0–44)
AST: 27 U/L (ref 15–41)
Albumin: 4 g/dL (ref 3.5–5.0)
Alkaline Phosphatase: 47 U/L (ref 38–126)
Anion gap: 9 (ref 5–15)
BUN: 16 mg/dL (ref 8–23)
CO2: 24 mmol/L (ref 22–32)
Calcium: 8.9 mg/dL (ref 8.9–10.3)
Chloride: 104 mmol/L (ref 98–111)
Creatinine, Ser: 0.86 mg/dL (ref 0.61–1.24)
GFR, Estimated: >60 mL/min (ref >60)
Glucose, Bld: 108 mg/dL — ABNORMAL HIGH (ref 70–99)
Potassium: 4.1 mmol/L (ref 3.5–5.1)
Sodium: 137 mmol/L (ref 135–145)
Total Bilirubin: 0.8 mg/dL (ref 0.3–1.2)
Total Protein: 7.7 g/dL (ref 6.5–8.1)

## 2021-07-09 LAB — TROPONIN I (HIGH SENSITIVITY): Troponin I (High Sensitivity): 5 ng/L (ref <18)

## 2021-07-09 LAB — BRAIN NATRIURETIC PEPTIDE: B Natriuretic Peptide: 31.4 pg/mL (ref 0.0–100.0)

## 2021-07-09 NOTE — ED Provider Notes (Signed)
[name] EMERGENCY DEPARTMENT Provider Note   CSN: [id number] Arrival date & time: 07/09/21  0160     History  Chief Complaint  Patient presents with   Shortness of Breath    Javier Arroyo is a 71 y.o. male.  Pt c/o waking up this AM and feeling sob. States wears cpap at night, generally sleeps flat, but occasional props up. Symptoms acute onset, moderate, now resolved. No associated chest pain or discomfort. No leg pain or swelling. Compliant w home meds. Denies cough or uri symptoms. No fever or chills. No palpitations. Also notes in past 1-2 months occasional episodes of feeling off balance or unsteady. No room spinning. No faintness/lightheadedness. No trauma or any falls. Episodes last seconds to minutes. No tinnitus, or hearing loss. No associated headache. No associated chest pain, discomfort or palpitations. No change in speech or vision. No numbness or weakness. No current dizziness or imbalance.   The history is provided by the patient and medical records.  Shortness of Breath Associated symptoms: no abdominal pain, no chest pain, no cough, no ear pain, no fever, no headaches, no neck pain, no rash, no sore throat and no vomiting       Home Medications Prior to Admission medications   Medication Sig Start Date End Date Taking? Authorizing Provider  aspirin 325 MG EC tablet Take 650 mg by mouth once.      [provider]  cephALEXin (KEFLEX) 500 MG capsule Take 2 capsules (1,000 mg total) by mouth 2 (two) times daily. 02/03/19   Charlesetta Shanks, MD  Coenzyme Q10 (CO Q 10) 100 MG CAPS Take 1 tablet by mouth daily.      [provider]  fexofenadine (ALLEGRA) 180 MG tablet Take 180 mg by mouth daily.    [provider]  fish oil-omega-3 fatty acids 1000 MG capsule Take 1 g by mouth daily.      [provider]  magnesium oxide (MAG-OX) 400 MG tablet Take 400 mg by mouth 2 (two) times daily.      [provider]   Multiple Vitamin (MULTIVITAMIN) tablet Take 1 tablet by mouth daily.      [provider]  nebivolol (BYSTOLIC) 10 MG tablet Take 10 mg by mouth daily.      [provider]  Tamsulosin HCl (FLOMAX) 0.4 MG CAPS Take 0.4 mg by mouth.      [provider]  Testosterone (ANDROGEL PUMP) 20.25 MG/ACT (1.62%) GEL Place 40.5 mg onto the skin daily.      [provider]  valsartan-hydrochlorothiazide (DIOVAN-HCT) 320-12.5 MG per tablet Take 1 tablet by mouth daily.      [provider]      Allergies    Statins and Penicillins    Review of Systems   Review of Systems  Constitutional:  Negative for chills and fever.  HENT:  Negative for ear pain, hearing loss, sore throat, tinnitus and trouble swallowing.   Eyes:  Negative for redness and visual disturbance.  Respiratory:  Positive for shortness of breath. Negative for cough.   Cardiovascular:  Negative for chest pain, palpitations and leg swelling.  Gastrointestinal:  Negative for abdominal pain, diarrhea and vomiting.  Genitourinary:  Negative for dysuria and flank pain.  Musculoskeletal:  Negative for back pain and neck pain.  Skin:  Negative for rash.  Neurological:  Negative for syncope, speech difficulty, weakness, numbness and headaches.  Hematological:  Does not bruise/bleed easily.  Psychiatric/Behavioral:  Negative for confusion.  Physical Exam Updated Vital Signs BP 119/79    Pulse (!) 56    Temp 98.2 F (36.8 C) (Oral)    Resp 18    Ht 1.778 m (5\' 10" )    Wt 116.6 kg    SpO2 99%    BMI 36.88 kg/m  Physical Exam Vitals and nursing note reviewed.  Constitutional:      Appearance: Normal appearance. He is well-developed.  HENT:     Head: Atraumatic.     Right Ear: Tympanic membrane normal.     Left Ear: Tympanic membrane normal.     Nose: Nose normal.     Mouth/Throat:     Mouth: Mucous membranes are moist.     Pharynx: Oropharynx is clear.  Eyes:     General: No scleral  icterus.    Conjunctiva/sclera: Conjunctivae normal.  Neck:     Trachea: No tracheal deviation.  Cardiovascular:     Rate and Rhythm: Regular rhythm. Bradycardia present.     Pulses: Normal pulses.     Heart sounds: Normal heart sounds. No murmur heard.   No friction rub. No gallop.  Pulmonary:     Effort: Pulmonary effort is normal. No accessory muscle usage or respiratory distress.     Breath sounds: Normal breath sounds.  Abdominal:     General: Bowel sounds are normal. There is no distension.     Palpations: Abdomen is soft.     Tenderness: There is no abdominal tenderness. There is no guarding.  Genitourinary:    Comments: No cva tenderness. Musculoskeletal:        General: No swelling or tenderness.     Cervical back: Normal range of motion and neck supple. No rigidity.     Right lower leg: No edema.     Left lower leg: No edema.  Skin:    General: Skin is warm and dry.     Findings: No rash.  Neurological:     General: No focal deficit present.     Mental Status: He is alert and oriented to person, place, and time.     Cranial Nerves: No cranial nerve deficit.     Comments: Alert, speech clear. Motor/sens grossly intact bil. No pronator drift, stre 5/5. Steady gait.   Psychiatric:        Mood and Affect: Mood normal.    ED Results / Procedures / Treatments   Labs (all labs ordered are listed, but only abnormal results are displayed) Results for orders placed or performed during the hospital encounter of 07/09/21  Resp Panel by RT-PCR (Flu A&B, Covid) Nasopharyngeal Swab   Specimen: Nasopharyngeal Swab; Nasopharyngeal(NP) swabs in vial transport medium  Result Value Ref Range   SARS Coronavirus 2 by RT PCR NEGATIVE NEGATIVE   Influenza A by PCR NEGATIVE NEGATIVE   Influenza B by PCR NEGATIVE NEGATIVE  CBC  Result Value Ref Range   WBC 9.1 4.0 - 10.5 K/uL   RBC 4.90 4.22 - 5.81 MIL/uL   Hemoglobin 14.6 13.0 - 17.0 g/dL   HCT 43.8 39.0 - 52.0 %   MCV 89.4 80.0 -  100.0 fL   MCH 29.8 26.0 - 34.0 pg   MCHC 33.3 30.0 - 36.0 g/dL   RDW 13.6 11.5 - 15.5 %   Platelets 174 150 - 400 K/uL   nRBC 0.0 0.0 - 0.2 %  Comprehensive metabolic panel  Result Value Ref Range   Sodium 137 135 - 145 mmol/L   Potassium 4.1 3.5 -  5.1 mmol/L   Chloride 104 98 - 111 mmol/L   CO2 24 22 - 32 mmol/L   Glucose, Bld 108 (H) 70 - 99 mg/dL   BUN 16 8 - 23 mg/dL   Creatinine, Ser 0.86 0.61 - 1.24 mg/dL   Calcium 8.9 8.9 - 10.3 mg/dL   Total Protein 7.7 6.5 - 8.1 g/dL   Albumin 4.0 3.5 - 5.0 g/dL   AST 27 15 - 41 U/L   ALT 31 0 - 44 U/L   Alkaline Phosphatase 47 38 - 126 U/L   Total Bilirubin 0.8 0.3 - 1.2 mg/dL   GFR, Estimated >60 >60 mL/min   Anion gap 9 5 - 15  Brain natriuretic peptide  Result Value Ref Range   B Natriuretic Peptide 31.4 0.0 - 100.0 pg/mL  Troponin I (High Sensitivity)  Result Value Ref Range   Troponin I (High Sensitivity) 5 <18 ng/L   DG Chest 2 View  Result Date: 07/09/2021 CLINICAL DATA:  Shortness of breath EXAM: CHEST - 2 VIEW COMPARISON:  06/10/2019 FINDINGS: Left thyroidectomy and enlarged right lobe thyroid, known from 2009 CT with mass effect on the non stenotic trachea. Extrapleural thickening bilaterally which is from fat based on prior CT and is fairly symmetric. There is no edema, consolidation, effusion, or pneumothorax. Prior median sternotomy. Normal heart size. Spondylosis with multiple bridging osteophytes. IMPRESSION: No acute finding when compared to prior. Electronically Signed   By: Jorje Guild M.D.   On: 07/09/2021 07:49    EKG None  Radiology DG Chest 2 View  Result Date: 07/09/2021 CLINICAL DATA:  Shortness of breath EXAM: CHEST - 2 VIEW COMPARISON:  06/10/2019 FINDINGS: Left thyroidectomy and enlarged right lobe thyroid, known from 2009 CT with mass effect on the non stenotic trachea. Extrapleural thickening bilaterally which is from fat based on prior CT and is fairly symmetric. There is no edema, consolidation,  effusion, or pneumothorax. Prior median sternotomy. Normal heart size. Spondylosis with multiple bridging osteophytes. IMPRESSION: No acute finding when compared to prior. Electronically Signed   By: Jorje Guild M.D.   On: 07/09/2021 07:49    Procedures Procedures    Medications Ordered in ED Medications - No data to display  ED Course/ Medical Decision Making/ A&P                           Medical Decision Making  Iv ns. Continuous pulse ox and cardiac monitoring. Stat labs. Ecg. Cxr. Ct.   Reviewed nursing notes and prior charts for additional history. Outside records reviewed. Additional hx from fam member.   Diff dx includes acs, pna, chf, cva, etc. Consider possible need for inpatient evaluation.   Labs reviewed/interpreted by me - trop normal.   Cardiac monitor - sinus brady, rate mid 50s - pt indicates that is normal hr for him, is on bblocker therapy.  CXR reviewed/interpreted by me - no failure or pna.   CT  reviewed/interpreted by me - no hem.   Recheck pt, pt remains asymptomatic. No sob. No current or recent cp of any sort. No dizziness or balance issues currently.   Rec continue asa qday.  Close pcp f/u.  Return precautions provided.         Final Clinical Impression(s) / ED Diagnoses Final diagnoses:  None    Rx / DC Orders ED Discharge Orders     None         Lajean Saver, MD 07/09/21  0850 ° °

## 2021-07-09 NOTE — Discharge Instructions (Addendum)
It was our pleasure to provide your ER care today - we hope that you feel better.  Overall, your ER tests look good/normal.   Continue to take an aspirin a day.   Follow up closely with your doctor in the next 1-2 weeks.   Your heart  rate is mildly slow (likely due to beta blocker therapy) - it is possible at times your heart rate is becoming low, and contributing to symptoms - discuss meds/doses with your doctor at follow up with them.  Return to ER if worse, new symptoms, fevers, increased trouble breathing, chest pain, fainting, persistent dizziness or unsteady gait, numbness/weakness, change in speech or vision, or other concern.

## 2021-07-09 NOTE — ED Triage Notes (Signed)
Pt states he woke up from sleep (while wearing his CPAP) and felt SHOB. He removed the CPAP and had some improvement but that every time he tried to go back to sleep he felt SHOB. Pt also mentions intermittent "imbalanced feeling" for ~2 weeks. The imbalanced feelings last "minutes" then resolve.

## 2021-07-09 NOTE — ED Notes (Signed)
Ambulated on r/a, SpO2 95-99%, HR 65-77, denies increased WOB.

## 2022-12-29 ENCOUNTER — Emergency Department (HOSPITAL_BASED_OUTPATIENT_CLINIC_OR_DEPARTMENT_OTHER): Payer: Medicare Other

## 2022-12-29 ENCOUNTER — Encounter (HOSPITAL_BASED_OUTPATIENT_CLINIC_OR_DEPARTMENT_OTHER): Payer: Self-pay

## 2022-12-29 DIAGNOSIS — Z7982 Long term (current) use of aspirin: Secondary | ICD-10-CM | POA: Insufficient documentation

## 2022-12-29 DIAGNOSIS — I10 Essential (primary) hypertension: Secondary | ICD-10-CM | POA: Diagnosis not present

## 2022-12-29 DIAGNOSIS — Y9389 Activity, other specified: Secondary | ICD-10-CM | POA: Insufficient documentation

## 2022-12-29 DIAGNOSIS — M7021 Olecranon bursitis, right elbow: Secondary | ICD-10-CM | POA: Insufficient documentation

## 2022-12-29 DIAGNOSIS — M25421 Effusion, right elbow: Secondary | ICD-10-CM | POA: Diagnosis present

## 2022-12-29 NOTE — ED Triage Notes (Addendum)
Pt PTA noticed a lg knot on rt. Elbow.   No pain Appears to be fluid filled

## 2022-12-30 ENCOUNTER — Emergency Department (HOSPITAL_BASED_OUTPATIENT_CLINIC_OR_DEPARTMENT_OTHER)
Admission: EM | Admit: 2022-12-30 | Discharge: 2022-12-30 | Disposition: A | Discharge status: Home / Self Care | Payer: Medicare Other | Attending: Emergency Medicine | Admitting: Emergency Medicine

## 2022-12-30 DIAGNOSIS — M7021 Olecranon bursitis, right elbow: Secondary | ICD-10-CM

## 2022-12-30 MED ORDER — LIDOCAINE-EPINEPHRINE (PF) 2 %-1:200000 IJ SOLN
10.0000 mL | Freq: Once | INTRAMUSCULAR | Status: AC
  Administered 2022-12-30: 10 mL via INTRADERMAL
  Filled 2022-12-30: qty 20

## 2022-12-30 NOTE — ED Provider Notes (Signed)
[facility] Provider Note: Javier Dell, MD, FACEP  CSN: [id number] MRN: [id number] ARRIVAL: 12/29/22 at 2145 ROOM: [facility]   CHIEF COMPLAINT  Elbow Problem   HISTORY OF PRESENT ILLNESS  12/30/22 1:21 AM FAVOR HACKLER is a 72 y.o. male who noticed swelling of his right elbow yesterday evening about 930.  He is not aware of any trauma.  He had not noticed it before that.  There is no pain or tenderness.   Past Medical History:  Diagnosis Date   Arthritis    Diverticulitis    GERD (gastroesophageal reflux disease)    Hypertension    Sleep apnea    Thyroid tumor, benign     Past Surgical History:  Procedure Laterality Date   APPENDECTOMY     CATARACT EXTRACTION Left    COLON RESECTION     ROTATOR CUFF REPAIR     STAPEDES SURGERY     THYROID LOBECTOMY      Family History  Problem Relation Age of Onset   Thyroid disease Mother    Hypertension Mother    Alzheimer's disease Father    Glaucoma Father     Social History   Tobacco Use   Smoking status: Never   Smokeless tobacco: Never  Vaping Use   Vaping Use: Never used  Substance Use Topics   Alcohol use: Yes    Alcohol/week: 3.0 standard drinks of alcohol    Types: 3 Glasses of wine per week    Comment: occasional   Drug use: No    Prior to Admission medications   Medication Sig Start Date End Date Taking? Authorizing Provider  aspirin 325 MG EC tablet Take 650 mg by mouth once.      [provider]  cephALEXin (KEFLEX) 500 MG capsule Take 2 capsules (1,000 mg total) by mouth 2 (two) times daily. 02/03/19   Arby Barrette, MD  Coenzyme Q10 (CO Q 10) 100 MG CAPS Take 1 tablet by mouth daily.      [provider]  fexofenadine (ALLEGRA) 180 MG tablet Take 180 mg by mouth daily.    [provider]  fish oil-omega-3 fatty acids 1000 MG capsule Take 1 g by mouth daily.      [provider]  magnesium oxide (MAG-OX) 400 MG tablet Take 400 mg by mouth 2 (two)  times daily.      [provider]  Multiple Vitamin (MULTIVITAMIN) tablet Take 1 tablet by mouth daily.      [provider]  nebivolol (BYSTOLIC) 10 MG tablet Take 10 mg by mouth daily.      [provider]  Tamsulosin HCl (FLOMAX) 0.4 MG CAPS Take 0.4 mg by mouth.      [provider]  Testosterone (ANDROGEL PUMP) 20.25 MG/ACT (1.62%) GEL Place 40.5 mg onto the skin daily.      [provider]  valsartan-hydrochlorothiazide (DIOVAN-HCT) 320-12.5 MG per tablet Take 1 tablet by mouth daily.      [provider]    Allergies Statins and Penicillins   REVIEW OF SYSTEMS  Negative except as noted here or in the History of Present Illness.   PHYSICAL EXAMINATION  Initial Vital Signs Blood pressure (!) 141/78, pulse 68, temperature 97.6 F (36.4 C), temperature source Oral, resp. rate 18, height 5\' 8"  (1.727 m), weight 98 kg, SpO2 97 %.  Examination General: Well-developed, well-nourished male in no acute distress; appearance consistent with age of record HENT: normocephalic; atraumatic Eyes: Normal appearance  Neck: supple Heart: regular rate and rhythm Lungs: clear to auscultation bilaterally Abdomen: soft; nondistended; nontender; bowel sounds present Extremities: Nontender swelling of the right olecranon bursa, rubbery to palpation, no erythema or warmth Neurologic: Awake, alert and oriented; motor function intact in all extremities and symmetric; no facial droop Skin: Warm and dry Psychiatric: Normal mood and affect   RESULTS  Summary of this visit's results, reviewed and interpreted by myself:   EKG Interpretation  Date/Time:    Ventricular Rate:    PR Interval:    QRS Duration:   QT Interval:    QTC Calculation:   R Axis:     Text Interpretation:         Laboratory Studies: No results found for this or any previous visit (from the past 24 hour(s)). Imaging Studies: DG Elbow Complete Right  Result Date:  12/29/2022 CLINICAL DATA:  Swelling EXAM: RIGHT ELBOW - COMPLETE 3+ VIEW COMPARISON:  None Available. FINDINGS: There is no evidence of fracture, dislocation, or joint effusion. There is no evidence of arthropathy or other focal bone abnormality. Soft tissues are unremarkable. IMPRESSION: Negative. Electronically Signed   By: Larose Hires D.O.   On: 12/29/2022 22:46    ED COURSE and MDM  Nursing notes, initial and subsequent vitals signs, including pulse oximetry, reviewed and interpreted by myself.  Vitals:   12/29/22 2151 12/29/22 2153  BP:  (!) 141/78  Pulse:  68  Resp:  18  Temp:  97.6 F (36.4 C)  TempSrc:  Oral  SpO2:  97%  Weight: 98 kg   Height: 5\' 8"  (1.727 m)    Medications  lidocaine-EPINEPHrine (XYLOCAINE W/EPI) 2 %-1:200000 (PF) injection 10 mL (10 mLs Intradermal Given 12/30/22 0134)    The patient appears to have swelling of the right elbow consistent with olecranon bursitis.  It does not appear to be fluid-filled as an attempted aspiration only usability a small amount of blood.  It is not painful, erythematous or hot to suggest an infection.  The patient happens to have an appointment with his orthopedist tomorrow and he was advised to bring this to his attention.  PROCEDURES  Procedures ASPIRATION After informed verbal consent was obtained the patient's right olecranon bursa was prepped with Betadine.  The overlying skin was anesthetized with 1 mL of 2% lidocaine with epinephrine.  An 18-gauge needle was then placed into the bursa and about 1 mL of blood was aspirated.  No pus or fluid was aspirated.  The patient tolerated this well and there were no immediate complications.  ED DIAGNOSES     ICD-10-CM   1. Olecranon bursitis of right elbow  M70.21          Amritpal Shropshire, Jonny Ruiz, MD 12/30/22 [phone]

## 2023-07-21 IMAGING — CR DG CHEST 2V
2 series · 2 of 2 positions shown · non-contrast
Comparison: 06/10/2019

CLINICAL DATA: Shortness of breath

EXAM:
CHEST - 2 VIEW

[w chest pa *]
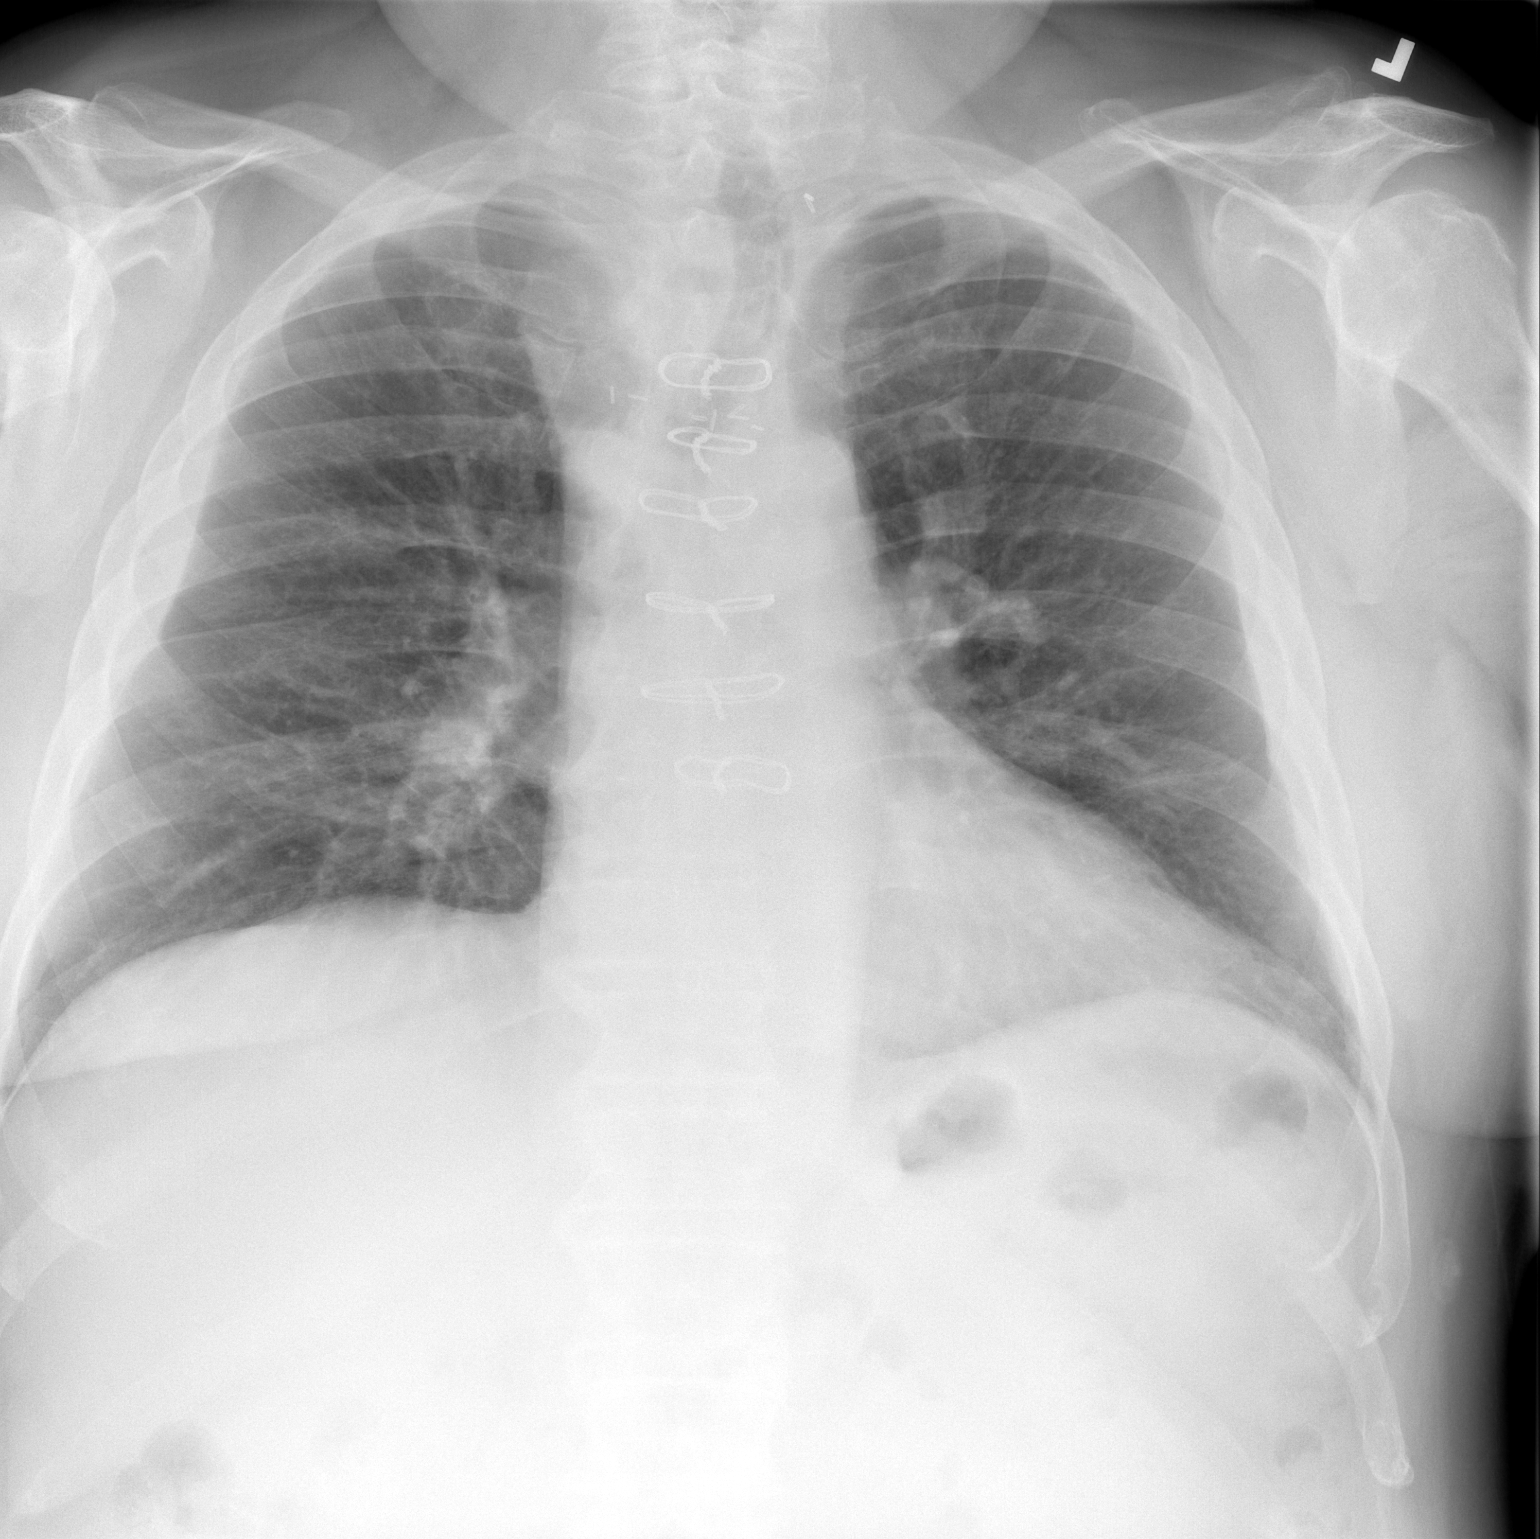

[w chest lat *]
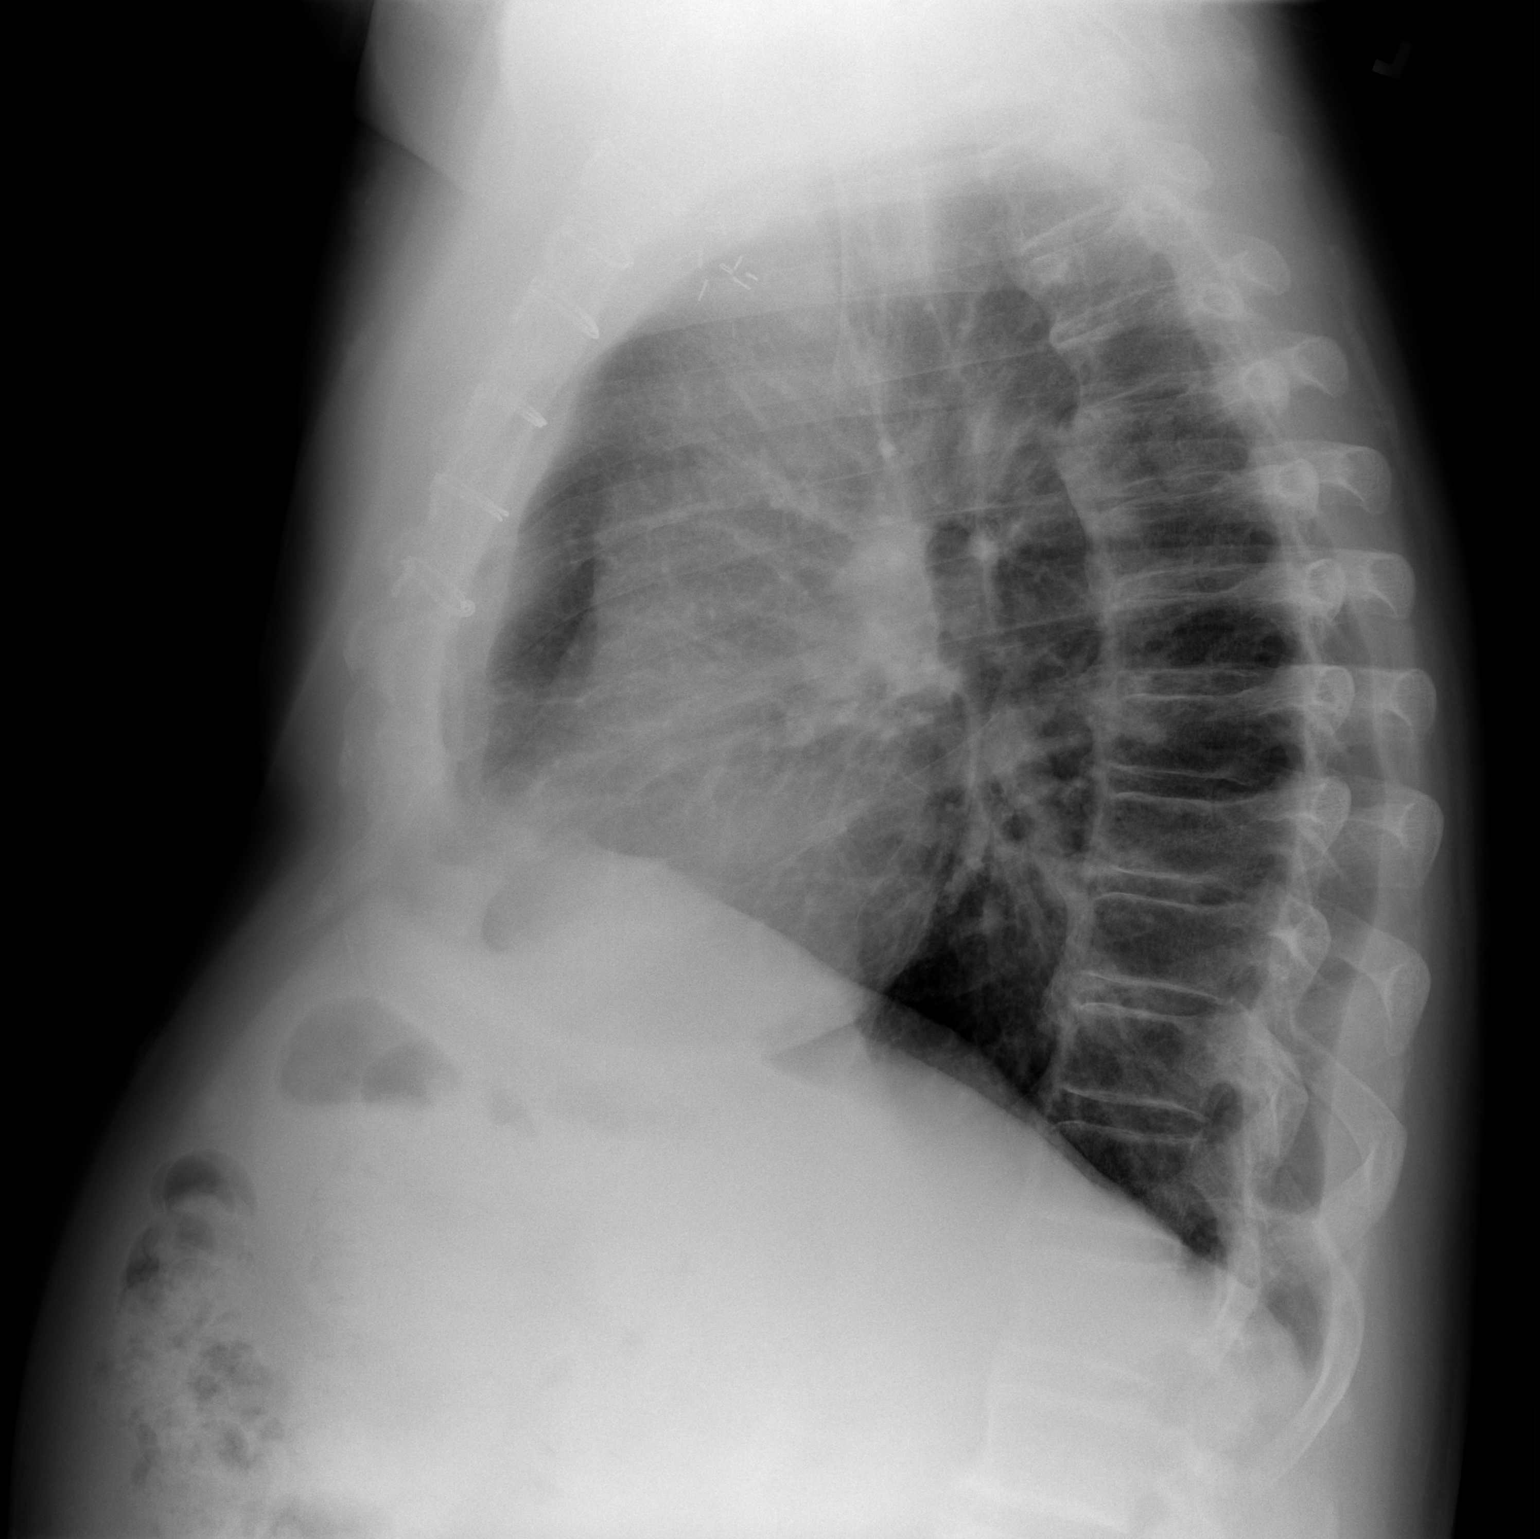

[2 of 2 positions shown; findings below may reference images not displayed]

FINDINGS: Left thyroidectomy and enlarged right lobe thyroid, known from 4997
CT with mass effect on the non stenotic trachea.

Extrapleural thickening bilaterally which is from fat based on prior
CT and is fairly symmetric.

There is no edema, consolidation, effusion, or pneumothorax. Prior
median sternotomy. Normal heart size.

Spondylosis with multiple bridging osteophytes.
IMPRESSION: No acute finding when compared to prior.

## 2023-07-21 IMAGING — CT CT HEAD W/O CM
3 series · 15 of 47 positions shown, 18 images · non-contrast
Comparison: May 06, 2005

CLINICAL DATA: Dizziness

EXAM:
CT HEAD WITHOUT CONTRAST
TECHNIQUE: Contiguous axial images were obtained from the base of the skull
through the vertex without intravenous contrast.

[Series 2: head wo · axial · 0.45mm/px · z∈[-142,-12]mm · 9 of 32 slices shown, 12 images]
[im 3/32  brain]
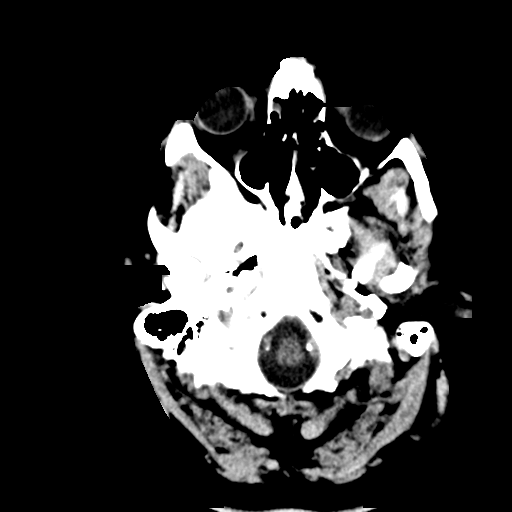
[im 3/32  bone]
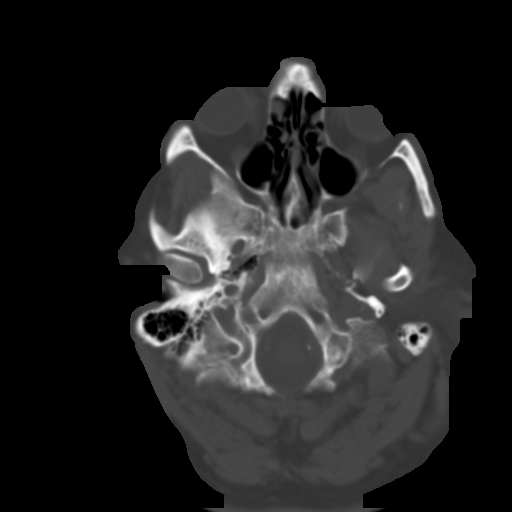
[im 6/32  brain]
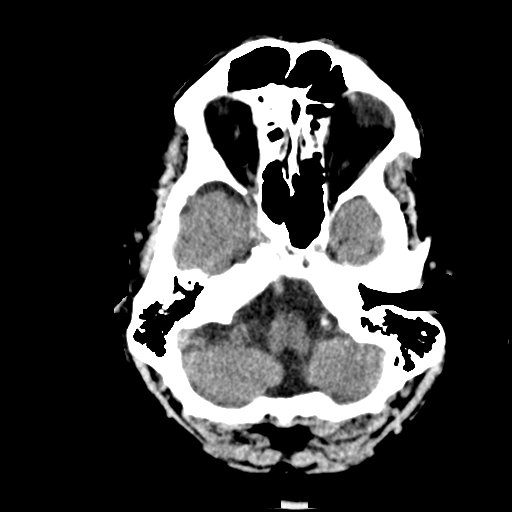
[im 9/32  brain]
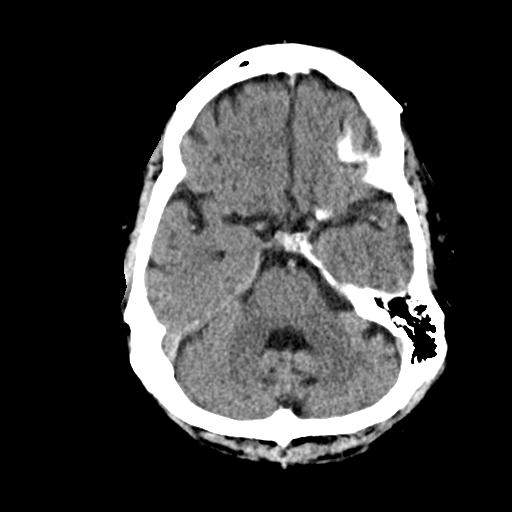
[im 12/32  brain]
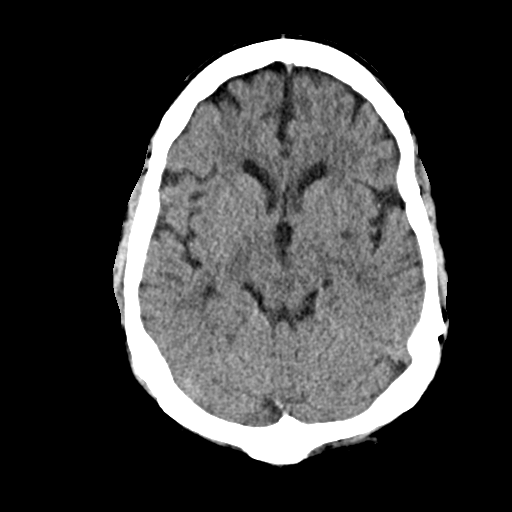
[im 17/32  brain]
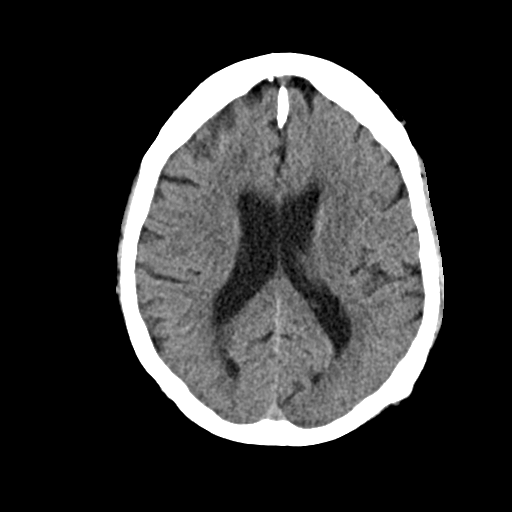
[im 17/32  bone]
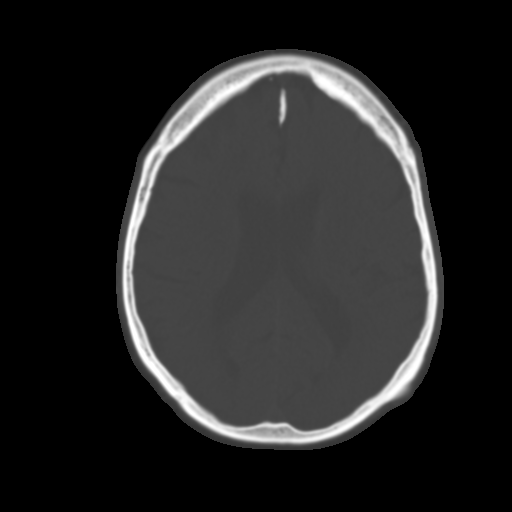
[im 20/32  brain]
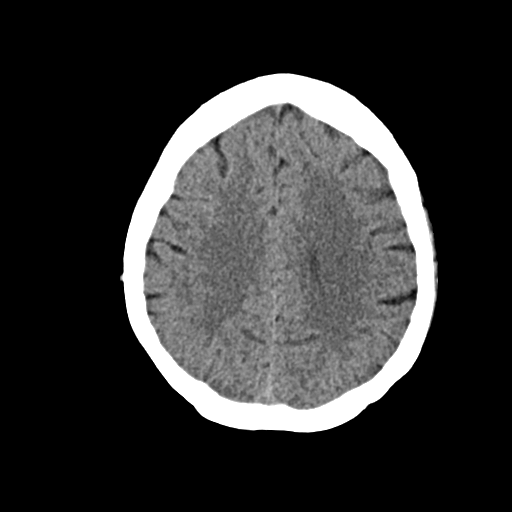
[im 23/32  brain]
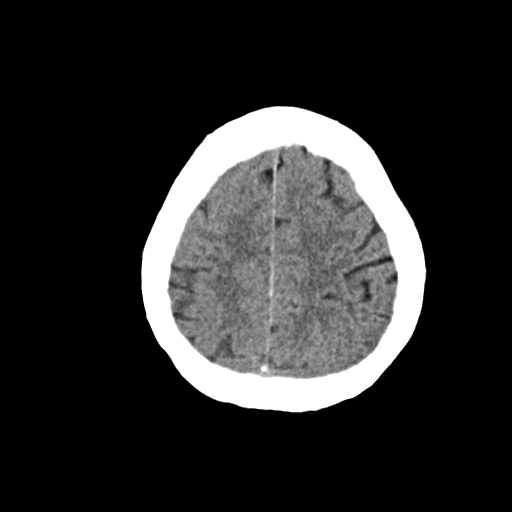
[im 26/32  brain]
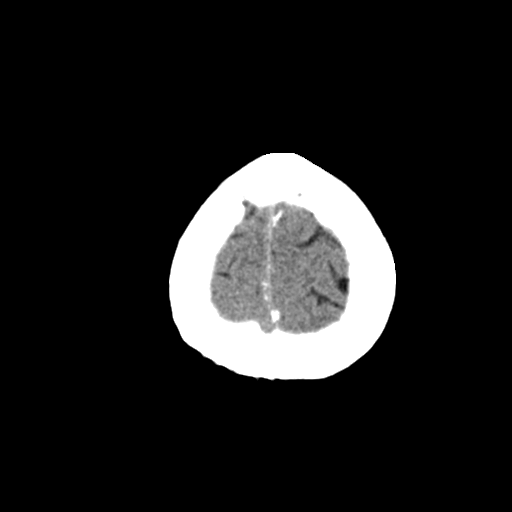
[im 29/32  brain]
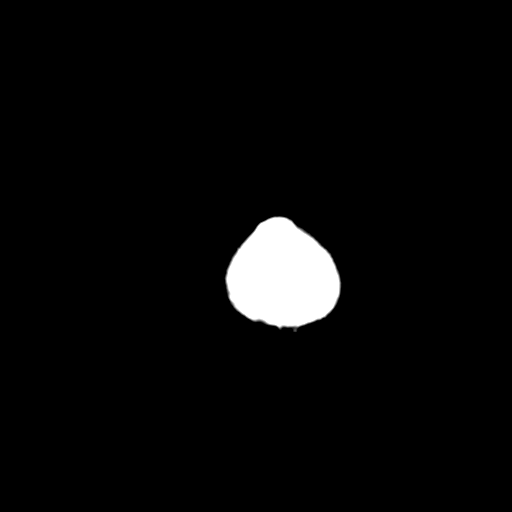
[im 29/32  bone]
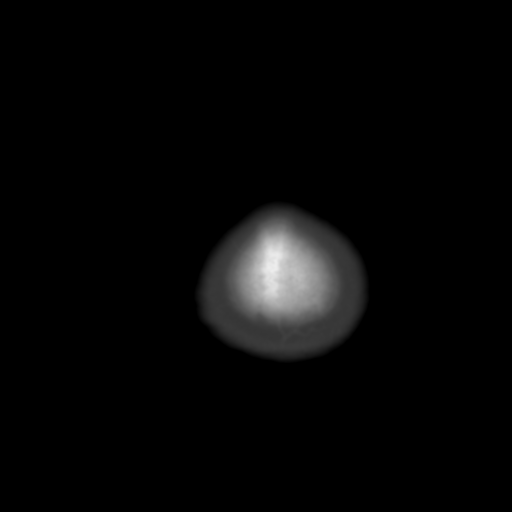

[Series 4: coronal soft · coronal · 0.31mm/px · 3 of 68 slices shown]
[im 23/68  brain]
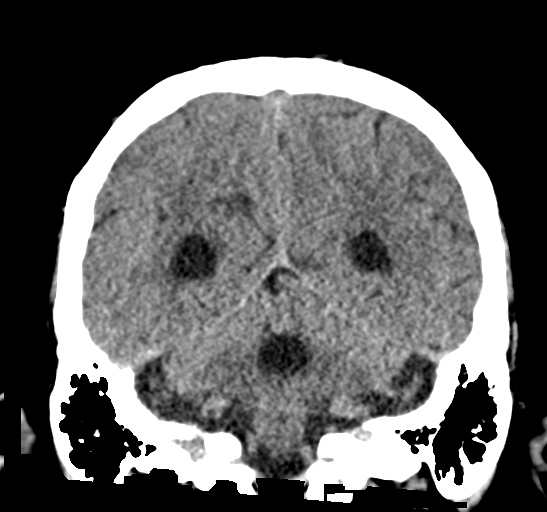
[im 30/68  brain]
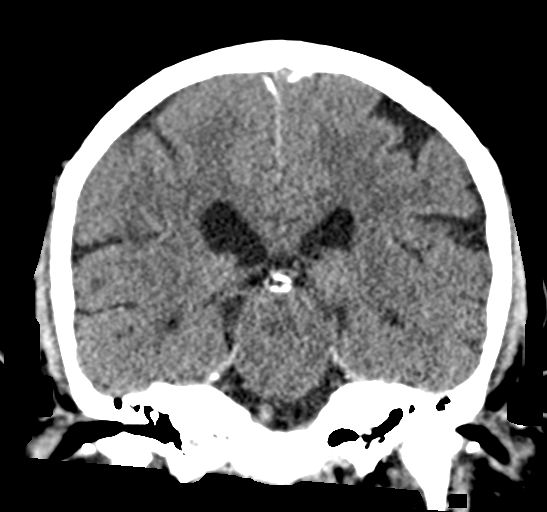
[im 38/68  brain]
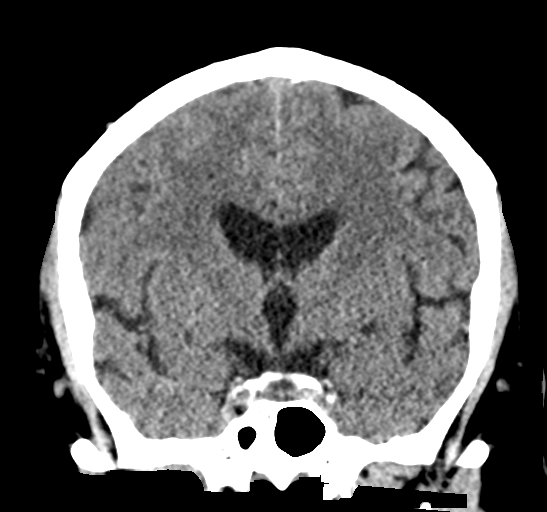

[Series 5: sag soft · sagittal · 0.32mm/px · 3 of 57 slices shown]
[im 19/57  brain]
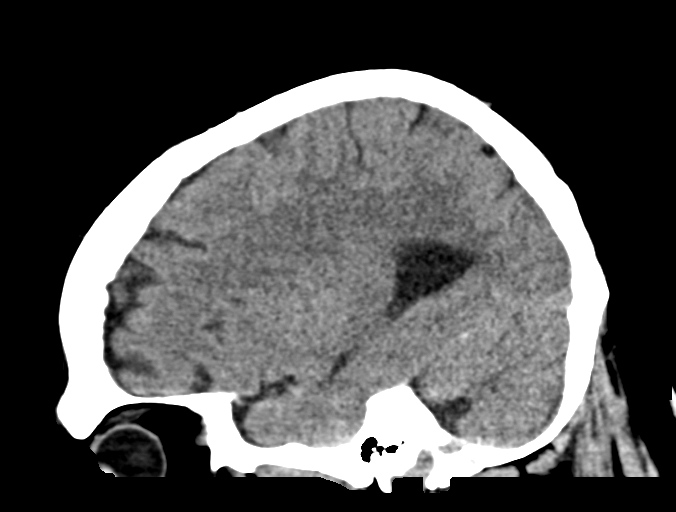
[im 29/57  brain]
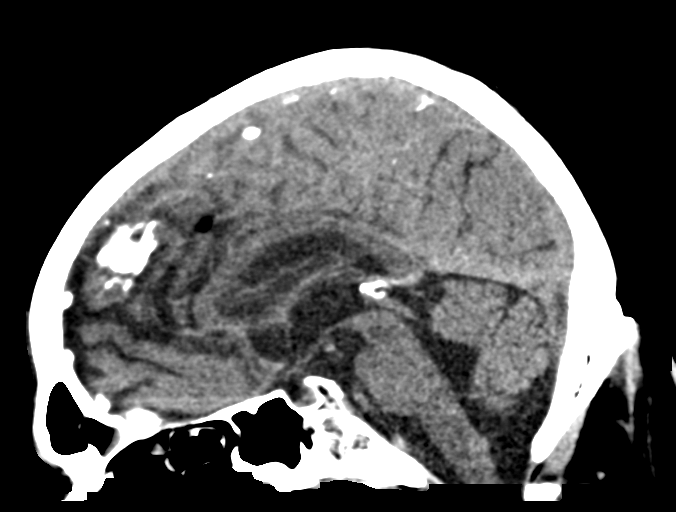
[im 38/57  brain]
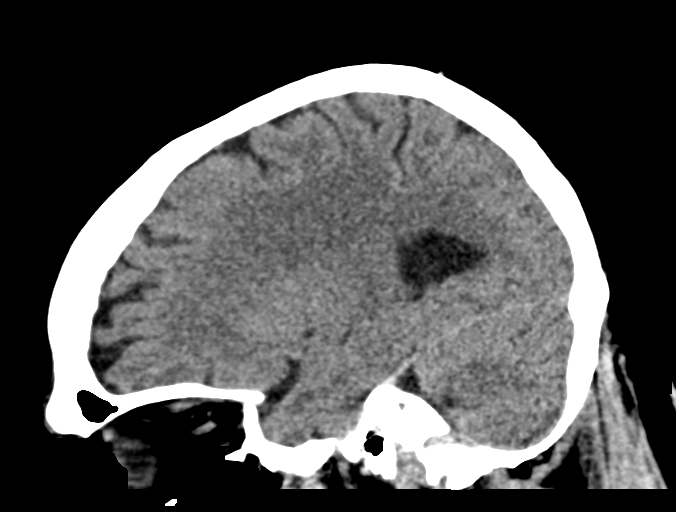

[15 of 47 positions shown; findings below may reference images not displayed]

FINDINGS: Brain: No evidence of acute infarction, hemorrhage, hydrocephalus,
extra-axial collection or mass lesion/mass effect. Mild
periventricular white matter small vessel ischemic changes are
noted. Small infarctions identified in the left basal ganglia.

Vascular: No hyperdense vessel is noted.

Skull: Normal. Negative for fracture or focal lesion.

Sinuses/Orbits: Mild mucoperiosteal thickening of bilateral ethmoid
sinuses are noted.

Other: None.
IMPRESSION: 1. No focal acute intracranial abnormality identified.
2. Mild periventricular white matter small vessel ischemic change.

## 2023-12-11 ENCOUNTER — Encounter (HOSPITAL_BASED_OUTPATIENT_CLINIC_OR_DEPARTMENT_OTHER): Payer: Self-pay | Admitting: Emergency Medicine

## 2023-12-11 ENCOUNTER — Other Ambulatory Visit: Payer: Self-pay

## 2023-12-11 ENCOUNTER — Emergency Department (HOSPITAL_BASED_OUTPATIENT_CLINIC_OR_DEPARTMENT_OTHER)
Admission: EM | Admit: 2023-12-11 | Discharge: 2023-12-11 | Disposition: A | Discharge status: Home / Self Care | Attending: Emergency Medicine | Admitting: Emergency Medicine

## 2023-12-11 DIAGNOSIS — Z79899 Other long term (current) drug therapy: Secondary | ICD-10-CM | POA: Diagnosis not present

## 2023-12-11 DIAGNOSIS — I10 Essential (primary) hypertension: Secondary | ICD-10-CM | POA: Diagnosis not present

## 2023-12-11 DIAGNOSIS — Z7901 Long term (current) use of anticoagulants: Secondary | ICD-10-CM | POA: Insufficient documentation

## 2023-12-11 DIAGNOSIS — R040 Epistaxis: Secondary | ICD-10-CM | POA: Insufficient documentation

## 2023-12-11 DIAGNOSIS — Z7982 Long term (current) use of aspirin: Secondary | ICD-10-CM | POA: Insufficient documentation

## 2023-12-11 HISTORY — DX: Acute embolism and thrombosis of unspecified deep veins of unspecified lower extremity: I82.409

## 2023-12-11 MED ORDER — OXYMETAZOLINE HCL 0.05 % NA SOLN
2.0000 | Freq: Once | NASAL | Status: AC
  Administered 2023-12-11: 2 via NASAL
  Filled 2023-12-11: qty 30

## 2023-12-11 NOTE — ED Notes (Signed)
 Administered Afrin in Right nare. Discharge instructions reviewed by provider. No bleeding at this time. Ambulatory at discharge with wife

## 2023-12-11 NOTE — ED Provider Notes (Signed)
  EMERGENCY DEPARTMENT AT [facility] Provider Note   CSN: [id number] Arrival date & time: 12/11/23  1253     History  Chief Complaint  Patient presents with   Epistaxis    Javier Arroyo is a 73 y.o. male who presents today with a 2-hour history of right-sided epistaxis.  Had frequent nosebleeds in the past, was able to control this nosebleed with direct pressure and application of nasal packing.  States that he had steady copious amounts of free bleeding from the right nare.  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  He has no lightheadedness, no shortness of breath, denies any acute headache, acute visual change.  He does have history of hypertension for which she does take medication.   Epistaxis      Home Medications Prior to Admission medications   Medication Sig Start Date End Date Taking? Authorizing Provider  aspirin 325 MG EC tablet Take 650 mg by mouth once.      [provider]  cephALEXin  (KEFLEX ) 500 MG capsule Take 2 capsules (1,000 mg total) by mouth 2 (two) times daily. 02/03/19   Wynetta Heckle, MD  Coenzyme Q10 (CO Q 10) 100 MG CAPS Take 1 tablet by mouth daily.      [provider]  fexofenadine (ALLEGRA) 180 MG tablet Take 180 mg by mouth daily.    [provider]  fish oil-omega-3 fatty acids 1000 MG capsule Take 1 g by mouth daily.      [provider]  magnesium oxide (MAG-OX) 400 MG tablet Take 400 mg by mouth 2 (two) times daily.      [provider]  Multiple Vitamin (MULTIVITAMIN) tablet Take 1 tablet by mouth daily.      [provider]  nebivolol (BYSTOLIC) 10 MG tablet Take 10 mg by mouth daily.      [provider]  Tamsulosin HCl (FLOMAX) 0.4 MG CAPS Take 0.4 mg by mouth.      [provider]  Testosterone (ANDROGEL PUMP) 20.25 MG/ACT (1.62%) GEL Place 40.5 mg onto the skin daily.      [provider]  valsartan-hydrochlorothiazide (DIOVAN-HCT) 320-12.5 MG per tablet Take 1 tablet by mouth daily.      [provider]      Allergies    Statins and Penicillins    Review of Systems   Review of Systems  HENT:  Positive for nosebleeds.   All other systems reviewed and are negative.   Physical Exam Updated Vital Signs BP (!) 152/79   Pulse 60   Temp 97.7 F (36.5 C) (Oral)   Resp 16   Ht 5\' 8"  (1.727 m)   Wt 103.4 kg   SpO2 99%   BMI 34.67 kg/m  Physical Exam Vitals and nursing note reviewed.  Constitutional:      General: He is not in acute distress.    Appearance: Normal appearance.  HENT:     Head: Normocephalic and atraumatic.     Nose: No nasal deformity, septal deviation, signs of injury, laceration, nasal tenderness or mucosal edema.     Right Nostril: Epistaxis present. No foreign body, septal hematoma or occlusion.     Left Nostril: No foreign body, epistaxis, septal hematoma or occlusion.     Right Turbinates: Enlarged and swollen. Not pale.     Left Turbinates: Enlarged and swollen. Not pale.     Comments: Prominent septal arteries  noted in the anterior naris.    Mouth/Throat:     Mouth: Mucous membranes are moist.     Pharynx: Oropharynx is clear.  Eyes:     Extraocular Movements: Extraocular movements intact.     Conjunctiva/sclera: Conjunctivae normal.     Pupils: Pupils are equal, round, and reactive to light.  Cardiovascular:     Rate and Rhythm: Normal rate and regular rhythm.     Pulses: Normal pulses.     Heart sounds: Normal heart sounds. No murmur heard.    No friction rub. No gallop.  Pulmonary:     Effort: Pulmonary effort is normal.     Breath sounds: Normal breath sounds.  Abdominal:     General: Abdomen is flat. Bowel sounds are normal.     Palpations: Abdomen is soft.  Musculoskeletal:        General: Normal range of motion.     Cervical back: Normal range of motion and neck supple.  Skin:    General: Skin is  warm and dry.     Capillary Refill: Capillary refill takes less than 2 seconds.  Neurological:     General: No focal deficit present.     Mental Status: He is alert. Mental status is at baseline.  Psychiatric:        Mood and Affect: Mood normal.     ED Results / Procedures / Treatments   Labs (all labs ordered are listed, but only abnormal results are displayed) Labs Reviewed - No data to display  EKG None  Radiology No results found.  Procedures Procedures    Medications Ordered in ED Medications  oxymetazoline  (AFRIN) 0.05 % nasal spray 2 spray (has no administration in time range)    ED Course/ Medical Decision Making/ A&P                                 Medical Decision Making Risk OTC drugs.   Medical Decision Making:   [name] is a 73 y.o. male who presented to the ED today with sided epistaxis detailed above.     Complete initial physical exam performed, notably the patient  was alert and oriented in no apparent distress.  He has bleeding under control with direct pressure and nasal packing applied prior to his arrival to the ED.  Neurologic exam is unremarkable.  Blood pressure is well-controlled on medication with normal blood pressure readings.     Reviewed and confirmed nursing documentation for past medical history, family history, social history.    Initial Assessment:   With the patient's presentation of epistaxis, most likely diagnosis is interior epistaxis. Other diagnoses were considered including (but not limited to) hypertension induced epistaxis. These are considered less likely due to history of present illness and physical exam findings.     Initial Plan:  Provide patient with 2 sprays of oxymetazoline  at the right nare to maintain hemostasis. Objective evaluation as below reviewed     Reassessment and Plan:   Hemostasis is achieved with direct pressure, have provided 2 sprays of oxymetazoline  to ensure continued hemostasis.   Refer to ENT for continued management of recurrent epistaxis.  Likely secondary to superficial septal arteries that may need chemical cauterization for definitive care.          Final Clinical Impression(s) / ED Diagnoses Final diagnoses:  Anterior epistaxis    Rx / DC Orders ED Discharge Orders  None         Juanetta Nordmann, [place] 12/11/23 1449    Teddi Favors, DO 12/16/23 [phone]

## 2023-12-11 NOTE — ED Triage Notes (Addendum)
 Nose bleed this am, stopped for a little while but started to bleed again.  Pt states he takes a blood thinner.  Pt on xarelto.  Pt has guaze in place to right nare. Slight nausea.  No obvious active bleeding noted.
